# Patient Record
Sex: Male | Born: 1950
Health system: Southern US, Community
[De-identification: ages and names within clinical notes are randomized; demographics above are authoritative.]

## PROBLEM LIST (undated history)

## (undated) HISTORY — PX: SPINE SURGERY: SHX786

---

## 2001-06-07 ENCOUNTER — Encounter: Payer: Self-pay | Admitting: Family Medicine

## 2001-06-07 ENCOUNTER — Ambulatory Visit (HOSPITAL_COMMUNITY): Admission: RE | Admit: 2001-06-07 | Discharge: 2001-06-07 | Payer: Self-pay | Admitting: Family Medicine

## 2008-03-31 ENCOUNTER — Ambulatory Visit: Payer: Self-pay | Admitting: Cardiology

## 2008-05-04 ENCOUNTER — Ambulatory Visit: Payer: Self-pay | Admitting: Cardiology

## 2011-04-01 NOTE — Assessment & Plan Note (Signed)
[facility]                            CARDIOLOGY OFFICE NOTE   David Frank, David Frank                        MRN:          [id number]  DATE:03/31/2008                            DOB:          [birth year]    PRIMARY CARE PHYSICIAN:  Mosetta Putt, M.D.   REASON FOR PRESENTATION:  Evaluate patient with chest pain.   HISTORY OF PRESENT ILLNESS:  The patient is a pleasant 60 year old white  gentleman with no prior cardiac history.  He has had some chest  discomfort starting in November or December.  He has had some electrical  electrical type discomfort in the left side.  It would happen 1-2  times per day.  However, over the past 4-6 weeks he has had more  discomfort under his sternum.  These have been short-lived, dull pains  that radiate under his clavicle.  When he is having this he feels like  he has some discomfort taking a deep breath.  It will happen multiple  times a day every day.  He thinks it has been a relatively stable  pattern over the last few weeks.  However, the other day he played  tennis, which he usually does.  He had no symptoms that night.  However,  the next day he felt numbness in both of his elbows.  His heart was  racing.  He had some discomfort.  It was about 4-5/10 in intensity.  He  was lightheaded.  It all went away spontaneously.  He is not sure how  long it lasted.  He had no associated nausea, vomiting or diaphoresis.  He had no radiation to his jaw.  He was able play tennis the next day  without bringing on these symptoms, though he thought he was slightly  more winded than he might otherwise have been.  Because of this he is  referred for further evaluation.   PAST MEDICAL HISTORY:  1. Dyslipidemia.  His triglycerides have been high and his HDL      slightly low.  2. Migraines.  3. Rosacea.   PAST SURGICAL HISTORY:  None.   ALLERGIES/INTOLERANCES:  CEFACLOR.   MEDICATIONS:  1. Lipitor 10 mg daily.  2.  Nabumetone 750 mg daily.  3. Amitriptyline 30 mg daily.  4. Betamethasone.   SOCIAL HISTORY:  The patient is a Actor.  He is married.  He has 2 children, ages 29 and 26.  He never smoked cigarettes.  He  drinks alcohol socially.   FAMILY HISTORY:  Noncontributory for early coronary disease.  His father  died at 64 of pulmonary fibrosis.   REVIEW OF SYSTEMS:  Positive for migraines, some difficulty swallowing  in the past, some difficulty sleeping following the death of this  father.  Negative for other systems.   PHYSICAL EXAMINATION:  The patient is well-appearing and in no distress.  Blood pressure 128/80, heart rate 93 and regular.  HEENT:  Eyelids unremarkable.  Pupils equal, round and reactive to  light.  Fundi within normal limits.  Oral mucosa unremarkable.  NECK:  No jugular distention at 45 degrees.  Carotid upstroke brisk and  symmetric.  No bruits, no thyromegaly.  LYMPHATICS:  No cervical, axillary or inguinal adenopathy.  LUNGS:  Clear to auscultation bilaterally.  BACK:  No costovertebral angle tenderness.  CHEST:  Unremarkable.  HEART:  PMI not displaced or sustained, S1 and S2 within normal limits,  no S3, no S4, no clicks, no rubs, no murmurs.  ABDOMEN:  Obese, positive bowel sounds, normal in frequency and pitch.  No bruits, no rebound, no guarding, no midline pulsatile mass, no  hepatomegaly, no splenomegaly.  SKIN:  No rashes, no nodules.  EXTREMITIES:  Pulses 2+ throughout.  No edema, no cyanosis, no clubbing.  NEUROLOGIC:  Oriented to person, place and time.  Cranial nerves II-XII  grossly intact.  Motor grossly intact throughout.   EKG (done in Dr. Geoffery Lyons office):  Sinus rhythm, rate 68, axis within  normal limits, intervals within normal limits, no acute ST-T wave  changes.   ASSESSMENT/PLAN:  1. Chest discomfort.  The patient's chest discharged comfort is      atypical.  However, he did have a worrisome episode with discomfort       of both his arms.  Given this, I am going to schedule him for a      plain old exercise treadmill (POET).  This will be an appropriate      test given the low-moderate pretest probability of obstructive      coronary disease.  This will allow me to risk-stratify him.  Also      it will allow me to give him a prescription for exercise as he is      wanting to get back on his exercise bicycle.  Though he is active      and plays tennis, he probably could have a more routine regimen.  2. Obesity.  We discussed the need to lose weight with diet and      exercise.  He is obese and fit but still could lose weight.  Again,      I will advise him at the time of his stress test.  3. Dyslipidemia.  We discussed this.  He has predominately low HDL      now.  This is difficult to treat and he is doing all the dietary      changes I would suggest.  Hopefully, increased exercise will      increase it about 10%.  He remained on the Lipitor.  4. Palpitations.  This was in the context of his discomfort.  He      thinks his resting heart rate has been a little higher than usual.      We will assess his heart rate response and heart rate      recovery with stress test.  5. Follow-up will be at the time of the POET.     Rollene Rotunda, MD, [facility]  Electronically Signed    JH/MedQ  DD: 03/31/2008  DT: 03/31/2008  Job #: [id number]   cc:   Mosetta Putt, M.D.

## 2011-04-01 NOTE — Procedures (Signed)
[facility]                              EXERCISE SAVIO, ALBRECHT                        MRN:          [id number]  DATE:05/04/2008                            DOB:          22-Sep-1951    PRIMARY CARE PHYSICIAN:  Mosetta Putt, MD.   PROCEDURE:  Exercise treadmill test.   INDICATIONS:  Evaluate the patient with chest pain.   PROCEDURE NOTE:  The patient was exercised using standard Bruce  protocol.  He was able to exercise for 9 minutes, which completed stage  III.  He achieved 10.4 METs.  He achieved a 164 beats per minute, which  was 99% of his target heart rate.  His blood pressure response was  appropriate with a maximum of 174/71.  He had no chest discomfort.  He  had no dyspnea.  There were no ischemic ST-T wave changes.  He had a  normal heart rate recovery.   CONCLUSION:  Negative adequate exercise treadmill test.   PLAN:  Based on the above, there is no evidence of high-grade  obstructive coronary artery disease.  He had a reasonable exercise  tolerance and probably could have gone further if I had pushed him more.  Based on this study, I did give him specific instructions for an  exercise regimen.  I do not think any further cardiovascular testing is  suggested.  Of note, his resting heart rate was slightly high, but I do  believe that he would be able to bring this down with a routine of  aerobic exercise.  He does play tennis, but will probably focus more on  routine aerobic exercise.     Rollene Rotunda, MD, [facility]  Electronically Signed    JH/MedQ  DD: 05/04/2008  DT: 05/05/2008  Job #: [id number]   cc:   Mosetta Putt, M.D.

## 2015-11-14 ENCOUNTER — Ambulatory Visit
Admission: RE | Admit: 2015-11-14 | Discharge: 2015-11-14 | Disposition: A | Discharge status: Home / Self Care | Payer: BC Managed Care – PPO | Source: Ambulatory Visit | Attending: Nurse Practitioner | Admitting: Nurse Practitioner

## 2015-11-14 ENCOUNTER — Other Ambulatory Visit: Payer: Self-pay | Admitting: Nurse Practitioner

## 2015-11-14 DIAGNOSIS — S76012A Strain of muscle, fascia and tendon of left hip, initial encounter: Secondary | ICD-10-CM

## 2015-11-14 DIAGNOSIS — M545 Low back pain, unspecified: Secondary | ICD-10-CM

## 2016-03-27 ENCOUNTER — Other Ambulatory Visit: Payer: Self-pay | Admitting: Internal Medicine

## 2016-03-27 DIAGNOSIS — M545 Low back pain, unspecified: Secondary | ICD-10-CM

## 2016-03-31 ENCOUNTER — Ambulatory Visit
Admission: RE | Admit: 2016-03-31 | Discharge: 2016-03-31 | Disposition: A | Discharge status: Home / Self Care | Payer: BC Managed Care – PPO | Source: Ambulatory Visit | Attending: Internal Medicine | Admitting: Internal Medicine

## 2016-03-31 DIAGNOSIS — M545 Low back pain, unspecified: Secondary | ICD-10-CM

## 2016-08-27 ENCOUNTER — Encounter: Payer: Self-pay | Admitting: Physical Therapy

## 2016-08-27 ENCOUNTER — Ambulatory Visit: Payer: BC Managed Care – PPO | Attending: Neurosurgery | Admitting: Physical Therapy

## 2016-08-27 DIAGNOSIS — R262 Difficulty in walking, not elsewhere classified: Secondary | ICD-10-CM | POA: Diagnosis present

## 2016-08-27 DIAGNOSIS — M5442 Lumbago with sciatica, left side: Secondary | ICD-10-CM | POA: Insufficient documentation

## 2016-08-27 DIAGNOSIS — M6281 Muscle weakness (generalized): Secondary | ICD-10-CM | POA: Diagnosis present

## 2016-08-27 DIAGNOSIS — G8929 Other chronic pain: Secondary | ICD-10-CM | POA: Diagnosis present

## 2016-08-27 NOTE — Patient Instructions (Signed)
PELVIC TILT: Posterior    Tighten abdominals, flatten low back. _15__ reps, hold each 5 seconds , __2_ days per week   Copyright  VHI. All rights reserved.   On Elbows (Prone)    Rise up on elbows as high as possible, keeping hips on floor. Hold _2 minutes___ seconds.  Do __3__ sets per session. Do __2__ sessions per day.  http://orth.exer.us/92     Hamstring Stretch    With other leg bent, foot flat, grasp right leg and slowly try to straighten knee. Hold __30__ seconds. Repeat __5_ times. Do _2_ sessions per day.  [email or web address].[email or web address]   C

## 2016-08-27 NOTE — Therapy (Signed)
[facility] [address] [place], [place], [address] Phone: [phone]   Fax:  [phone]  Physical Therapy Evaluation  Patient Details  Name: David Frank MRN: [id number] Date of Birth: 1951/07/05 Referring Provider: Shirlean Kelly, MD  Encounter Date: 08/27/2016      PT End of Session - 08/27/16 1203    Visit Number 1   Number of Visits 13   Date for PT Re-Evaluation 10/27/16   PT Start Time 1010   PT Stop Time 1100   PT Time Calculation (min) 50 min   Activity Tolerance Patient tolerated treatment well   Behavior During Therapy [facility] for tasks assessed/performed      History reviewed. No pertinent past medical history.  Past Surgical History:  Procedure Laterality Date  . SPINE SURGERY      There were no vitals filed for this visit.       Subjective Assessment - 08/27/16 1015    Subjective Pt arriving to therapy c/o low back pain since December 2016. Pt s/p L4-L% discectomy on 04/16/16. Pt reporting relief from sciatic pain running down his left LE following surgery. Pt c/o of midline low back pain in lumbar region.    How long can you sit comfortably? 15 minutes   How long can you stand comfortably? 10 minutes   How long can you walk comfortably? pain to walk   Diagnostic tests X-ray 03/2016   Patient Stated Goals play tennis   Currently in Pain? Yes   Pain Score 5    Pain Location Back   Pain Orientation Mid;Lower   Pain Descriptors / Indicators Aching   Pain Type Chronic pain   Pain Onset More than a month ago   Pain Frequency Constant   Aggravating Factors  walking, bending, lifting   Pain Relieving Factors pain meds, positioning   Effect of Pain on Daily Activities I can't play tennis, I use to play competitively            [facility] PT Assessment - 08/27/16 0001      Assessment   Medical Diagnosis low back pain s/p l4-5 disectomy 04/16/16   Referring Provider Shirlean Kelly, MD   Onset Date/Surgical Date 04/16/16   Hand Dominance Right   Prior Therapy yes, prior to surgery     Precautions   Precautions None     Restrictions   Weight Bearing Restrictions No     Balance Screen   Has the patient fallen in the past 6 months No   Is the patient reluctant to leave their home because of a fear of falling?  No     Home [employer]ist information centre manager residence   Living Arrangements Spouse/significant other   Available Help at Discharge Family   Type of Home House   Home Access Stairs to enter   Entrance Stairs-Number of Steps 5   Entrance Stairs-Rails None   Home Layout One level   Home Equipment None   Additional Comments pt has lumbar belt that he wears to play tennis and do yard work     Prior Function   Level of Independence Independent   Vocation Retired   Leisure play tennis     Cognition   Overall Cognitive Status Within Functional Limits for tasks assessed     Observation/Other Assessments   Focus on Therapeutic Outcomes (FOTO)  53% limitation     Sensation   Light Touch Appears Intact     Coordination  Heel Shin Test intact     Posture/Postural Control   Posture/Postural Control Postural limitations   Postural Limitations Rounded Shoulders;Forward head     ROM / Strength   AROM / PROM / Strength AROM;Strength     AROM   AROM Assessment Site Hip;Knee   Right/Left Knee Right;Left     Strength   Strength Assessment Site Hip;Knee   Right/Left Hip Right;Left   Right Hip Flexion 5/5   Right Hip Extension 5/5   Right Hip ABduction 5/5   Right Hip ADduction 4/5   Left Hip Flexion 4/5   Left Hip Extension 4/5   Left Hip ABduction 5/5   Left Hip ADduction 4/5   Right/Left Knee Right;Left   Right Knee Flexion 5/5   Right Knee Extension 5/5   Left Knee Flexion 4/5   Left Knee Extension 5/5     Palpation   Patella mobility Pt tenderness noted over lumbar paraspinals bilateral sides.      Special Tests    Special Tests  Lumbar;Hip Special Tests   Lumbar Tests Straight Leg Raise   Hip Special Tests  Thomas Test     Straight Leg Raise   Findings Negative   Side  Right  left     Thomas Test    Findings Positive   Side Right;Left     Transfers   Transfers Sit to Stand   Five time sit to stand comments  17 seconds                           PT Education - 08/27/16 1201    Education provided Yes   Education Details HEP: hamstring stretch, prone on elbows for hip flexion and lumbar flexibility, PPT   Person(s) Educated Patient   Methods Explanation;Demonstration;Tactile cues;Verbal cues;Handout   Comprehension Verbalized understanding;Returned demonstration          PT Short Term Goals - 08/27/16 1216      PT SHORT TERM GOAL #1   Title Pt will be independent with his HEP.    Baseline issued begining exercises at eval   Time 3   Period Weeks   Status New     PT SHORT TERM GOAL #2   Title pt will be able to demonstrate correct body mechanics for lifting anf retrieving on object off the floor.    Baseline unable to do without pain   Time 3   Period Weeks   Status New     PT SHORT TERM GOAL #3   Title Pt will improve his pain to less than 3/10 with ADL's.    Baseline 5/10 at rest   Time 3   Period Weeks   Status New     PT SHORT TERM GOAL #4   Title Pt will be able to perform supine to sit with no pain using logroll technique.    Baseline pain reported during eval   Time 3   Period Weeks   Status New           PT Long Term Goals - 08/27/16 1221      PT LONG TERM GOAL #1   Title Pt wil lbe independent with HEP and progression.    Time 6   Period Weeks   Status New     PT LONG TERM GOAL #2   Title Pt will improve bilateral hip strength to 5/5 in order to improve functional mobility and gait.  Time 6   Period Weeks   Status New     PT LONG TERM GOAL #3   Title pt will be able to improve FOTO score from 53% limitation to less than 43% limitation in  order to improve functional mobility.    Time 6   Period Weeks   Status New     PT LONG TERM GOAL #4   Title Pt will improve R hamstring flexibility to >/=75 degrees to compare to his left side in order to improve gait and prevent injury.    Baseline left SLR= 80 degrees, R SLR= 62 degrees   Time 6   Period Weeks   Status New               Plan - 08/27/16 1206    Clinical Impression Statement pt presents following a L4-5 discectomy on 04/16/16 with increasing lumbar pain. Pt was an active tennis player prior to surgery and wishes to return to tennis. Pt with 5/10 pain reported today. Pt with mild weakness noted in his left hip and hamstring following MMT and positive thomas test bilaterally. Skilled PT needed to progress pt toward goals set to improve pt's functional mobility.     Rehab Potential Good   PT Frequency 2x / week   PT Duration 6 weeks   PT Treatment/Interventions ADLs/Self Care Home Management;Moist Heat;Therapeutic activities;Therapeutic exercise;Traction;Manual techniques;Dry needling;Taping;Passive range of motion;Patient/family education;Neuromuscular re-education;Balance training;Ultrasound;Gait training;Stair training;Functional mobility training;Electrical Stimulation;Cryotherapy;Iontophoresis 4mg /ml Dexamethasone   PT Next Visit Plan progress lumbar exercises for stretching and strengthening, measure lumabr ROM, review HEP   PT Home Exercise Plan hamstring stretches, prone on elbows, PPT   Consulted and Agree with Plan of Care Patient      Patient will benefit from skilled therapeutic intervention in order to improve the following deficits and impairments:  Pain, Decreased mobility, Decreased strength, Decreased activity tolerance, Impaired perceived functional ability, Difficulty walking, Decreased range of motion  Visit Diagnosis: Chronic bilateral low back pain with left-sided sciatica  Difficulty in walking, not elsewhere classified  Muscle weakness  (generalized)     Problem List There are no active problems to display for this patient.   Sharmon Leyden, MPT  08/27/2016, 12:28 PM   [facility] [address] [place], [place], [address] Phone: [phone]   Fax:  [phone]  Name: David Frank MRN: [id number] Date of Birth: [birth year]

## 2016-09-03 ENCOUNTER — Ambulatory Visit: Payer: BC Managed Care – PPO | Admitting: Physical Therapy

## 2016-09-03 ENCOUNTER — Encounter: Payer: Self-pay | Admitting: Physical Therapy

## 2016-09-03 DIAGNOSIS — G8929 Other chronic pain: Secondary | ICD-10-CM

## 2016-09-03 DIAGNOSIS — M6281 Muscle weakness (generalized): Secondary | ICD-10-CM

## 2016-09-03 DIAGNOSIS — R262 Difficulty in walking, not elsewhere classified: Secondary | ICD-10-CM

## 2016-09-03 DIAGNOSIS — M5442 Lumbago with sciatica, left side: Principal | ICD-10-CM

## 2016-09-03 NOTE — Therapy (Signed)
[facility] [address], [place], [place], [address] Phone: [phone]   Fax:  [phone]  Physical Therapy Treatment  Patient Details  Name: David Frank MRN: [id number] Date of Birth: October 22, 1951 Referring Provider: Jovita Gamma, MD  Encounter Date: 09/03/2016      PT End of Session - 09/03/16 1224    Visit Number 2   Number of Visits 13   Date for PT Re-Evaluation 10/27/16   PT Start Time 9480   PT Stop Time 1245   PT Time Calculation (min) 60 min   Activity Tolerance Patient tolerated treatment well   Behavior During Therapy [facility] for tasks assessed/performed      History reviewed. No pertinent past medical history.  Past Surgical History:  Procedure Laterality Date  . SPINE SURGERY      There were no vitals filed for this visit.      Subjective Assessment - 09/03/16 1152    Subjective I still have the pain. Since last visit I am having a pain in the medial left thigh.    How long can you sit comfortably? 15 minutes   How long can you stand comfortably? 10 minutes   How long can you walk comfortably? pain to walk   Diagnostic tests X-ray 03/2016   Patient Stated Goals play tennis   Currently in Pain? Yes   Pain Score 2   10/10 worse   Pain Location Back   Pain Orientation Mid;Lower   Pain Descriptors / Indicators Aching;Dull   Pain Type Chronic pain   Pain Radiating Towards radiat to the left inner thigh   Pain Onset More than a month ago   Pain Frequency Constant   Aggravating Factors  step the wrong way, walking, bending, lifting   Pain Relieving Factors pain medication, postioning                         [facility] Adult PT Treatment/Exercise - 09/03/16 0001      Exercises   Exercises Lumbar     Lumbar Exercises: Stretches   Active Hamstring Stretch 2 reps;30 seconds   Active Hamstring Stretch Limitations sitting with pulling in left lumbar   Piriformis Stretch 2  reps;30 seconds  bil.      Modalities   Modalities Electrical Stimulation;Moist Heat     Moist Heat Therapy   Number Minutes Moist Heat 20 Minutes   Moist Heat Location Lumbar Spine  prone     Electrical Stimulation   Electrical Stimulation Location lumbar in prone   Electrical Stimulation Action IFC   Electrical Stimulation Parameters 20 min, to patient tolerance   Electrical Stimulation Goals Pain     Manual Therapy   Manual Therapy Soft tissue mobilization   Soft tissue mobilization lumbar and thoracic paraspinals in prone                PT Education - 09/03/16 1223    Education provided Yes   Education Details stretches and abdominal bracing   Person(s) Educated Patient   Methods Explanation;Demonstration;Verbal cues;Handout   Comprehension Returned demonstration;Verbalized understanding          PT Short Term Goals - 09/03/16 1227      PT SHORT TERM GOAL #1   Title Pt will be independent with his HEP.    Baseline issued begining exercises at eval   Time 3   Period Weeks   Status On-going     PT SHORT  TERM GOAL #2   Title pt will be able to demonstrate correct body mechanics for lifting anf retrieving on object off the floor.    Baseline unable to do without pain   Time 3   Period Weeks   Status New     PT SHORT TERM GOAL #3   Title Pt will improve his pain to less than 3/10 with ADL's.    Baseline 5/10 at rest   Time 3   Period Weeks   Status New     PT SHORT TERM GOAL #4   Title Pt will be able to perform supine to sit with no pain using logroll technique.    Baseline pain reported during eval   Time 3   Period Weeks   Status New           PT Long Term Goals - 08/27/16 1221      PT LONG TERM GOAL #1   Title Pt wil lbe independent with HEP and progression.    Time 6   Period Weeks   Status New     PT LONG TERM GOAL #2   Title Pt will improve bilateral hip strength to 5/5 in order to improve functional mobility and gait.     Time 6   Period Weeks   Status New     PT LONG TERM GOAL #3   Title pt will be able to improve FOTO score from 53% limitation to less than 43% limitation in order to improve functional mobility.    Time 6   Period Weeks   Status New     PT LONG TERM GOAL #4   Title Pt will improve R hamstring flexibility to >/=75 degrees to compare to his left side in order to improve gait and prevent injury.    Baseline left SLR= 80 degrees, R SLR= 62 degrees   Time 6   Period Weeks   Status New               Plan - 09/03/16 1224    Clinical Impression Statement Patient is able to perfrom his stretches correctly.  Patient has not met goals due to just starting therapy.  Patient needed many vebal cues to contract his abdominal without leg pain.  Patient needs verbal cues to brace his abdominal while getting  on and off the mat. Patient has tightness in paraspinals.  Patient will benefit from skilled therapy to reduce pain and improve function.    Rehab Potential Good   Clinical Impairments Affecting Rehab Potential None   PT Frequency 2x / week   PT Duration 6 weeks   PT Treatment/Interventions ADLs/Self Care Home Management;Moist Heat;Therapeutic activities;Therapeutic exercise;Traction;Manual techniques;Dry needling;Taping;Passive range of motion;Patient/family education;Neuromuscular re-education;Balance training;Ultrasound;Gait training;Stair training;Functional mobility training;Electrical Stimulation;Cryotherapy;Iontophoresis 33m/ml Dexamethasone   PT Next Visit Plan progress lumbar exercises for stretching and strengthening, measure lumabr ROM, body mechanics with daily activities   PT Home Exercise Plan progress as needed   Recommended Other Services none   Consulted and Agree with Plan of Care Patient      Patient will benefit from skilled therapeutic intervention in order to improve the following deficits and impairments:  Pain, Decreased mobility, Decreased strength, Decreased  activity tolerance, Impaired perceived functional ability, Difficulty walking, Decreased range of motion  Visit Diagnosis: Chronic bilateral low back pain with left-sided sciatica  Difficulty in walking, not elsewhere classified  Muscle weakness (generalized)     Problem List There are no active problems to display for  this patient.   Earlie Counts, PT 09/03/16 12:28 PM   [facility] [address], [place], [place], [address] Phone: [phone]   Fax:  [phone]  Name: David Frank MRN: [id number] Date of Birth: 15-Aug-1951

## 2016-09-03 NOTE — Patient Instructions (Addendum)
Piriformis Stretch, Sitting    Sit, one ankle on opposite knee, same-side hand on crossed knee. Push down on knee, keeping spine straight. Lean torso forward, with flat back, until tension is felt in hamstrings and gluteals of crossed-leg side. Hold _30__ seconds.  Repeat _2__ times per session. Do _1__ sessions per day.  Copyright  VHI. All rights reserved.  Chair Sitting    Sit at edge of seat, spine straight, one leg extended. Put a hand on each thigh and bend forward from the hip, keeping spine straight. Allow hand on extended leg to reach toward toes. Support upper body with other arm. Hold _30__ seconds. Repeat __2_ times per session. Do _1__ sessions per day.  Copyright  VHI. All rights reserved.  Concentric Contraction With Pelvic Floor (Hook-Lying)    Lie with hips and knees bent. Slowly inhale, and then exhale. Pull navel toward spine and tighten pelvic floor. Hold 5 sec Relax. Repeat _5__ times. Do _1__ times a day.   Copyright  VHI. All rights reserved.  Bracing With Knee Fallout (Hook-Lying)    With neutral spine, tighten pelvic floor and abdominals and hold. Alternating legs, drop knee out to side. Keep opposite hip still. Repeat _10__ times. Do __1_ times a day.   Copyright  VHI. All rights reserved.  [facility] [address], [address] [place], [place] [address] Phone # [phone] Fax [phone]

## 2016-09-05 ENCOUNTER — Ambulatory Visit: Payer: BC Managed Care – PPO | Admitting: Physical Therapy

## 2016-09-05 DIAGNOSIS — R262 Difficulty in walking, not elsewhere classified: Secondary | ICD-10-CM

## 2016-09-05 DIAGNOSIS — M5442 Lumbago with sciatica, left side: Secondary | ICD-10-CM | POA: Diagnosis not present

## 2016-09-05 DIAGNOSIS — M6281 Muscle weakness (generalized): Secondary | ICD-10-CM

## 2016-09-05 DIAGNOSIS — G8929 Other chronic pain: Secondary | ICD-10-CM

## 2016-09-05 NOTE — Patient Instructions (Signed)
       Perform each stretch 3 times on each LE holding 20-30 seconds each. Perform 2 times a day.

## 2016-09-05 NOTE — Therapy (Signed)
[facility] [address], [address] [place], [place], [address] Phone: [phone]   Fax:  [phone]  Physical Therapy Treatment  Patient Details  Name: David Frank MRN: [id number] Date of Birth: Nov 07, 1951 Referring Provider: Shirlean Kelly, MD  Encounter Date: 09/05/2016      PT End of Session - 09/05/16 1051    Visit Number 3   Number of Visits 13   Date for PT Re-Evaluation 10/27/16   PT Start Time 1015   PT Stop Time 1100   PT Time Calculation (min) 45 min   Activity Tolerance Patient tolerated treatment well   Behavior During Therapy [facility] for tasks assessed/performed      No past medical history on file.  Past Surgical History:  Procedure Laterality Date  . SPINE SURGERY      There were no vitals filed for this visit.      Subjective Assessment - 09/05/16 1022    Subjective Pt reported still having intermittent back pain and currently reporting 5-6/10. Pt reported feeling relief following last session with IFC and heat. Pt reported he started using heating pad at honme which seems to help with stretching.    Limitations Lifting;House hold activities;Sitting   How long can you sit comfortably? 15 minutes   How long can you stand comfortably? 10 minutes   How long can you walk comfortably? pain to walk   Diagnostic tests X-ray 03/2016   Patient Stated Goals play tennis   Currently in Pain? Yes   Pain Score 6    Pain Location Back   Pain Orientation Mid;Lower   Pain Type Chronic pain   Pain Radiating Towards pt reporting occassional radiation down lwft thigh   Pain Onset More than a month ago   Pain Frequency Intermittent   Aggravating Factors  stepping the wrong way, walking, bending, lifting   Pain Relieving Factors pain medication, positioning and heat   Effect of Pain on Daily Activities I want to return to playing tennis   Multiple Pain Sites No                         [facility]  Adult PT Treatment/Exercise - 09/05/16 0001      Exercises   Exercises Lumbar     Lumbar Exercises: Stretches   Active Hamstring Stretch 2 reps;30 seconds   Single Knee to Chest Stretch 3 reps;20 seconds   Piriformis Stretch 3 reps;30 seconds  bil.      Lumbar Exercises: Aerobic   Stationary Bike NUSTEP 8 minutes level 2     Lumbar Exercises: Supine   Bridge 10 reps;5 seconds   Bridge Limitations decreased lift from mat table   Other Supine Lumbar Exercises supine marching x 10 reps                PT Education - 09/05/16 1050    Education provided Yes   Education Details new stretrches added to [employer]) Educated Patient   Methods Explanation;Demonstration;Handout;Tactile cues;Verbal cues   Comprehension Verbalized understanding;Returned demonstration          PT Short Term Goals - 09/05/16 1114      PT SHORT TERM GOAL #1   Title Pt will be independent with his HEP.    Baseline issued begining exercises at eval, new exercises added today   Time 3   Period Weeks   Status On-going     PT SHORT TERM GOAL #2  Title pt will be able to demonstrate correct body mechanics for lifting anf retrieving on object off the floor.    Baseline unable to do without pain   Time 3   Period Weeks   Status New     PT SHORT TERM GOAL #3   Title Pt will improve his pain to less than 3/10 with ADL's.    Baseline 5/10 at rest   Period Weeks   Status New     PT SHORT TERM GOAL #4   Title Pt will be able to perform supine to sit with no pain using logroll technique.    Baseline pain reported during eval   Period Weeks   Status New           PT Long Term Goals - 08/27/16 1221      PT LONG TERM GOAL #1   Title Pt wil lbe independent with HEP and progression.    Time 6   Period Weeks   Status New     PT LONG TERM GOAL #2   Title Pt will improve bilateral hip strength to 5/5 in order to improve functional mobility and gait.    Time 6   Period Weeks   Status  New     PT LONG TERM GOAL #3   Title pt will be able to improve FOTO score from 53% limitation to less than 43% limitation in order to improve functional mobility.    Time 6   Period Weeks   Status New     PT LONG TERM GOAL #4   Title Pt will improve R hamstring flexibility to >/=75 degrees to compare to his left side in order to improve gait and prevent injury.    Baseline left SLR= 80 degrees, R SLR= 62 degrees   Time 6   Period Weeks   Status New               Plan - 09/05/16 1052    Clinical Impression Statement Pt arriving to therapy complaining of 6/10 lumbar pain. Pt performed his HEP stretches and 2 new exercises added. pt reporting relief after using the IFC and heat. Pt tolerated well still requiring therpy to imporove strength, ROM, decrease pain and improve functional mobility.    Rehab Potential Good   Clinical Impairments Affecting Rehab Potential None   PT Frequency 2x / week   PT Duration 6 weeks   PT Treatment/Interventions ADLs/Self Care Home Management;Moist Heat;Therapeutic activities;Therapeutic exercise;Traction;Manual techniques;Dry needling;Taping;Passive range of motion;Patient/family education;Neuromuscular re-education;Balance training;Ultrasound;Gait training;Stair training;Functional mobility training;Electrical Stimulation;Cryotherapy;Iontophoresis 4mg /ml Dexamethasone   PT Next Visit Plan progress lumbar exercises for stretching and strengthening, measure lumabr ROM, body mechanics with daily activities   PT Home Exercise Plan progress as needed   Consulted and Agree with Plan of Care Patient      Patient will benefit from skilled therapeutic intervention in order to improve the following deficits and impairments:  Pain, Decreased mobility, Decreased strength, Decreased activity tolerance, Impaired perceived functional ability, Difficulty walking, Decreased range of motion  Visit Diagnosis: Chronic bilateral low back pain with left-sided  sciatica  Difficulty in walking, not elsewhere classified  Muscle weakness (generalized)     Problem List There are no active problems to display for this patient.   Sharmon LeydenJennifer R Malikiah Debarr, MPT  09/05/2016, 11:17 AM   [facility] [address], [address] [place], [place], [address] Phone: [phone]   Fax:  [phone]  Name: David PersonRichard Frank MRN: [id number] Date  of Birth: 15-Jun-1951

## 2016-09-09 ENCOUNTER — Ambulatory Visit: Payer: BC Managed Care – PPO | Admitting: Physical Therapy

## 2016-09-09 ENCOUNTER — Encounter: Payer: Self-pay | Admitting: Physical Therapy

## 2016-09-09 DIAGNOSIS — G8929 Other chronic pain: Secondary | ICD-10-CM

## 2016-09-09 DIAGNOSIS — M6281 Muscle weakness (generalized): Secondary | ICD-10-CM

## 2016-09-09 DIAGNOSIS — M5442 Lumbago with sciatica, left side: Secondary | ICD-10-CM | POA: Diagnosis not present

## 2016-09-09 DIAGNOSIS — R262 Difficulty in walking, not elsewhere classified: Secondary | ICD-10-CM

## 2016-09-09 NOTE — Therapy (Signed)
[facility] [address], [address] [place], [place], [address] Phone: [phone]   Fax:  [phone]  Physical Therapy Treatment  Patient Details  Name: David Frank MRN: [id number] Date of Birth: 1951-08-26 Referring Provider: Shirlean Kelly, MD  Encounter Date: 09/09/2016      PT End of Session - 09/09/16 0938    Visit Number 4   Number of Visits 13   Date for PT Re-Evaluation 10/27/16   PT Start Time 0932   PT Stop Time 1031   PT Time Calculation (min) 59 min   Activity Tolerance Patient tolerated treatment well   Behavior During Therapy [facility] for tasks assessed/performed      History reviewed. No pertinent past medical history.  Past Surgical History:  Procedure Laterality Date  . SPINE SURGERY      There were no vitals filed for this visit.      Subjective Assessment - 09/09/16 0936    Subjective Pt reports still having a catch on the ride side. Other pain areas are easing up but Rt side continues to intermittently catch.    Limitations Lifting;House hold activities;Sitting   How long can you sit comfortably? 15 minutes   How long can you stand comfortably? 10 minutes   How long can you walk comfortably? pain to walk   Diagnostic tests X-ray 03/2016   Patient Stated Goals play tennis   Currently in Pain? Yes   Pain Score 2    Pain Location Back   Pain Orientation Mid;Lower   Pain Descriptors / Indicators Aching;Dull   Pain Type Chronic pain   Pain Onset More than a month ago                         [facility] Adult PT Treatment/Exercise - 09/09/16 0001      Lumbar Exercises: Standing   Other Standing Lumbar Exercises Pt educated on proper body mechanics  Practiced squat technique     Lumbar Exercises: Supine   Bridge 10 reps;5 seconds   Bridge Limitations decreased lift from mat table   Other Supine Lumbar Exercises supine marching x 10 reps     Modalities   Modalities Traction     Traction   Type of Traction Lumbar   Min (lbs) 50   Max (lbs) 100   Hold Time 60 seconds   Rest Time 10 seconds   Time 15 minutes                  PT Short Term Goals - 09/09/16 9528      PT SHORT TERM GOAL #1   Title Pt will be independent with his HEP.    Baseline issued begining exercises at eval, new exercises added today   Time 3   Period Weeks   Status On-going     PT SHORT TERM GOAL #2   Title pt will be able to demonstrate correct body mechanics for lifting anf retrieving on object off the floor.    Baseline unable to do without pain   Time 3   Period Weeks   Status On-going     PT SHORT TERM GOAL #3   Title Pt will improve his pain to less than 3/10 with ADL's.    Baseline 5/10 at rest   Time 3   Period Weeks   Status On-going     PT SHORT TERM GOAL #4   Title Pt will be able to perform  supine to sit with no pain using logroll technique.    Baseline pain reported during eval   Time 3   Period Weeks   Status On-going           PT Long Term Goals - 09/09/16 0947      PT LONG TERM GOAL #1   Title Pt wil lbe independent with HEP and progression.    Time 6   Period Weeks   Status On-going     PT LONG TERM GOAL #2   Title Pt will improve bilateral hip strength to 5/5 in order to improve functional mobility and gait.    Time 6   Period Weeks   Status On-going     PT LONG TERM GOAL #3   Title pt will be able to improve FOTO score from 53% limitation to less than 43% limitation in order to improve functional mobility.    Time 6   Period Weeks   Status On-going     PT LONG TERM GOAL #4   Title Pt will improve R hamstring flexibility to >/=75 degrees to compare to his left side in order to improve gait and prevent injury.    Baseline left SLR= 80 degrees, R SLR= 62 degrees   Time 6   Period Weeks   Status On-going               Plan - 09/09/16 1011    Clinical Impression Statement Pt reports less lumbar pain today but  continues to have intermitent pain increase especially on Rt side. Pt has been doing home exercises and says the stretches are helping. Pt able to tolerate all core strenghtening exercises well today. Pt educated on proper body mechanics with lifting and transfering in and out of bed. Pt communicated and demonstrated understanding. Pt will continue to benefit from skilled therapy for core strength and stability.    Rehab Potential Good   Clinical Impairments Affecting Rehab Potential None   PT Frequency 2x / week   PT Duration 6 weeks   PT Treatment/Interventions ADLs/Self Care Home Management;Moist Heat;Therapeutic activities;Therapeutic exercise;Traction;Manual techniques;Dry needling;Taping;Passive range of motion;Patient/family education;Neuromuscular re-education;Balance training;Ultrasound;Gait training;Stair training;Functional mobility training;Electrical Stimulation;Cryotherapy;Iontophoresis 4mg /ml Dexamethasone   PT Next Visit Plan asses responce to mechanical traction, progress lumbar exercises for stretching and strengthening, measure lumabr ROM, body mechanics with daily activities   Consulted and Agree with Plan of Care Patient      Patient will benefit from skilled therapeutic intervention in order to improve the following deficits and impairments:  Pain, Decreased mobility, Decreased strength, Decreased activity tolerance, Impaired perceived functional ability, Difficulty walking, Decreased range of motion  Visit Diagnosis: Chronic bilateral low back pain with left-sided sciatica  Difficulty in walking, not elsewhere classified  Muscle weakness (generalized)     Problem List There are no active problems to display for this patient.   Dessa PhiKatherine Calynn Ferrero PTA 09/09/2016, 10:49 AM  [facility] [address], [address] [place], [place], [address] Phone: [phone]   Fax:  [phone]  Name: David PersonRichard Frank MRN:  [id number] Date of Birth: [birth year]

## 2016-09-12 ENCOUNTER — Encounter: Payer: Self-pay | Admitting: Physical Therapy

## 2016-09-12 ENCOUNTER — Ambulatory Visit: Payer: BC Managed Care – PPO | Admitting: Physical Therapy

## 2016-09-12 DIAGNOSIS — M5442 Lumbago with sciatica, left side: Secondary | ICD-10-CM | POA: Diagnosis not present

## 2016-09-12 DIAGNOSIS — R262 Difficulty in walking, not elsewhere classified: Secondary | ICD-10-CM

## 2016-09-12 DIAGNOSIS — G8929 Other chronic pain: Secondary | ICD-10-CM

## 2016-09-12 DIAGNOSIS — M6281 Muscle weakness (generalized): Secondary | ICD-10-CM

## 2016-09-12 NOTE — Therapy (Signed)
[facility] [address] [place], [place], [address] Phone: [phone]   Fax:  [phone]  Physical Therapy Treatment  Patient Details  Name: David Frank MRN: [id number] Date of Birth: Jul 26, 1951 Referring Provider: Shirlean Kelly, MD  Encounter Date: 09/12/2016      PT End of Session - 09/12/16 1019    Visit Number 5   Number of Visits 13   Date for PT Re-Evaluation 10/27/16   PT Start Time 1013   PT Stop Time 1117   PT Time Calculation (min) 64 min   Activity Tolerance Patient tolerated treatment well   Behavior During Therapy [facility] for tasks assessed/performed      History reviewed. No pertinent past medical history.  Past Surgical History:  Procedure Laterality Date  . SPINE SURGERY      There were no vitals filed for this visit.      Subjective Assessment - 09/12/16 1015    Subjective Pt reports feeling like hes making progress for the first time. Belives that traction helped last time but did have trouble immediatley after getting off of traction with increase muscle spasms. Felt much better the next day with less Rt sided pain.   Limitations Lifting;House hold activities;Sitting   How long can you sit comfortably? 15 minutes   How long can you stand comfortably? 10 minutes   How long can you walk comfortably? pain to walk   Diagnostic tests X-ray 03/2016   Patient Stated Goals play tennis   Currently in Pain? Yes   Pain Score 2    Pain Location Back   Pain Orientation Mid;Lower   Pain Descriptors / Indicators Aching;Dull   Pain Frequency Intermittent   Multiple Pain Sites No                         [facility] Adult PT Treatment/Exercise - 09/12/16 0001      Lumbar Exercises: Stretches   Single Knee to Chest Stretch 3 reps;20 seconds   Piriformis Stretch 3 reps;30 seconds  bil.      Lumbar Exercises: Aerobic   Stationary Bike NUSTEP 6 minutes level 2     Lumbar  Exercises: Supine   Ab Set 20 reps  on set ab sets; one set with red ball   Heel Slides 20 reps   Bridge 10 reps;5 seconds   Bridge Limitations decreased lift from mat table   Other Supine Lumbar Exercises supine marching x 10 reps     Modalities   Modalities Traction     Moist Heat Therapy   Number Minutes Moist Heat 15 Minutes   Moist Heat Location Lumbar Spine  prone     Electrical Stimulation   Electrical Stimulation Location lumbar in prone   Electrical Stimulation Action IFC   Electrical Stimulation Parameters To tolerance   Electrical Stimulation Goals Pain     Traction   Type of Traction Lumbar   Min (lbs) 50   Max (lbs) 100   Hold Time 60 seconds   Rest Time 10 seconds   Time 10                  PT Short Term Goals - 09/09/16 5284      PT SHORT TERM GOAL #1   Title Pt will be independent with his HEP.    Baseline issued begining exercises at eval, new exercises added today   Time 3   Period Weeks  Status On-going     PT SHORT TERM GOAL #2   Title pt will be able to demonstrate correct body mechanics for lifting anf retrieving on object off the floor.    Baseline unable to do without pain   Time 3   Period Weeks   Status On-going     PT SHORT TERM GOAL #3   Title Pt will improve his pain to less than 3/10 with ADL's.    Baseline 5/10 at rest   Time 3   Period Weeks   Status On-going     PT SHORT TERM GOAL #4   Title Pt will be able to perform supine to sit with no pain using logroll technique.    Baseline pain reported during eval   Time 3   Period Weeks   Status On-going           PT Long Term Goals - 09/09/16 0947      PT LONG TERM GOAL #1   Title Pt wil lbe independent with HEP and progression.    Time 6   Period Weeks   Status On-going     PT LONG TERM GOAL #2   Title Pt will improve bilateral hip strength to 5/5 in order to improve functional mobility and gait.    Time 6   Period Weeks   Status On-going     PT  LONG TERM GOAL #3   Title pt will be able to improve FOTO score from 53% limitation to less than 43% limitation in order to improve functional mobility.    Time 6   Period Weeks   Status On-going     PT LONG TERM GOAL #4   Title Pt will improve R hamstring flexibility to >/=75 degrees to compare to his left side in order to improve gait and prevent injury.    Baseline left SLR= 80 degrees, R SLR= 62 degrees   Time 6   Period Weeks   Status On-going               Plan - 09/12/16 1032    Clinical Impression Statement Pt had muscle spasms while getting off of traction table last session but believes traction helped. Pt did traction at begining of treatment and did exercises on traction table before getting off. Pt had a slight muscle spasm across back but not as bad. Able to tolerate all exerciese well and able to demonstrate good technique with sit to stand. Pt will continue to benefit from skilled therapy for core and LE strength and stability.    Rehab Potential Good   Clinical Impairments Affecting Rehab Potential None   PT Frequency 2x / week   PT Duration 6 weeks   PT Treatment/Interventions ADLs/Self Care Home Management;Moist Heat;Therapeutic activities;Therapeutic exercise;Traction;Manual techniques;Dry needling;Taping;Passive range of motion;Patient/family education;Neuromuscular re-education;Balance training;Ultrasound;Gait training;Stair training;Functional mobility training;Electrical Stimulation;Cryotherapy;Iontophoresis 4mg /ml Dexamethasone   PT Next Visit Plan progress lumbar exercises for stretching and strengthening, measure lumabr ROM, body mechanics with daily activities   Consulted and Agree with Plan of Care Patient      Patient will benefit from skilled therapeutic intervention in order to improve the following deficits and impairments:  Pain, Decreased mobility, Decreased strength, Decreased activity tolerance, Impaired perceived functional ability, Difficulty  walking, Decreased range of motion  Visit Diagnosis: Chronic bilateral low back pain with left-sided sciatica  Difficulty in walking, not elsewhere classified  Muscle weakness (generalized)     Problem List There are no active problems to display  for this patient.   Dessa PhiKatherine Keeva Reisen PTA 09/12/2016, 11:08 AM  [facility] [address], [address] [place], [place], [address] Phone: [phone]   Fax:  [phone]  Name: David Frank MRN: [id number] Date of Birth: [birth year]

## 2016-09-16 ENCOUNTER — Encounter: Payer: Self-pay | Admitting: Physical Therapy

## 2016-09-16 ENCOUNTER — Ambulatory Visit: Payer: BC Managed Care – PPO | Admitting: Physical Therapy

## 2016-09-16 DIAGNOSIS — M5442 Lumbago with sciatica, left side: Secondary | ICD-10-CM | POA: Diagnosis not present

## 2016-09-16 DIAGNOSIS — M6281 Muscle weakness (generalized): Secondary | ICD-10-CM

## 2016-09-16 DIAGNOSIS — G8929 Other chronic pain: Secondary | ICD-10-CM

## 2016-09-16 DIAGNOSIS — R262 Difficulty in walking, not elsewhere classified: Secondary | ICD-10-CM

## 2016-09-16 NOTE — Therapy (Signed)
[facility] [address] [place], [place], [address] Phone: [phone]   Fax:  [phone]  Physical Therapy Treatment  Patient Details  Name: David PersonRichard Alf MRN: [id number] Date of Birth: [birth year] Referring Provider: Shirlean Kellyobert Nudelman, MD  Encounter Date: 09/16/2016      PT End of Session - 09/16/16 0854    Visit Number 6   Number of Visits 13   Date for PT Re-Evaluation 10/27/16   PT Start Time 0846   PT Stop Time 0947   PT Time Calculation (min) 61 min   Activity Tolerance Patient tolerated treatment well   Behavior During Therapy [facility] for tasks assessed/performed      History reviewed. No pertinent past medical history.  Past Surgical History:  Procedure Laterality Date  . SPINE SURGERY      There were no vitals filed for this visit.      Subjective Assessment - 09/16/16 0851    Subjective Pt reports feeling ok after last session but continues to have a dull ache in back and increased pain with certain movements.    Limitations Lifting;House hold activities;Sitting   How long can you sit comfortably? 15 minutes   How long can you stand comfortably? 10 minutes   How long can you walk comfortably? pain to walk   Diagnostic tests X-ray 03/2016   Patient Stated Goals play tennis   Currently in Pain? Yes   Pain Score 3    Pain Location Back   Pain Orientation Mid;Lower   Pain Descriptors / Indicators Dull;Aching   Pain Type Chronic pain   Pain Onset More than a month ago   Pain Frequency Intermittent                         [facility] Adult PT Treatment/Exercise - 09/16/16 0001      Exercises   Exercises Knee/Hip     Lumbar Exercises: Stretches   Single Knee to Chest Stretch 3 reps;20 seconds   Piriformis Stretch 3 reps;30 seconds  bil.      Lumbar Exercises: Aerobic   Stationary Bike L1 6 minutes     Lumbar Exercises: Supine   Ab Set 20 reps  on set ab sets; one set with  red ball   Bridge 10 reps;5 seconds   Bridge Limitations 10 sets on ball with no hold at top     Knee/Hip Exercises: Seated   Sit to Sand 20 reps;without UE support     Knee/Hip Exercises: Supine   Straight Leg Raises Strengthening;Both;2 sets;10 reps     Knee/Hip Exercises: Sidelying   Hip ABduction Strengthening;Both;2 sets;10 reps   Hip ADduction Strengthening;Both;2 sets;10 reps     Knee/Hip Exercises: Prone   Hip Extension Strengthening;Both;2 sets;10 reps     Modalities   Modalities --     Moist Heat Therapy   Number Minutes Moist Heat 15 Minutes   Moist Heat Location Lumbar Spine  prone     Electrical Stimulation   Electrical Stimulation Location Lumbar spine   Electrical Stimulation Action IFC   Electrical Stimulation Parameters To tolerance   Electrical Stimulation Goals Pain                PT Education - 09/16/16 0946    Education provided Yes   Education Details straight leg raises   Frank(s) Educated Patient   Methods Explanation;Demonstration;Handout   Comprehension Verbalized understanding  PT Short Term Goals - 09/16/16 8119      PT SHORT TERM GOAL #1   Title Pt will be independent with his HEP.    Baseline issued begining exercises at eval, new exercises added today   Time 3   Period Weeks   Status Achieved     PT SHORT TERM GOAL #2   Title pt will be able to demonstrate correct body mechanics for lifting anf retrieving on object off the floor.    Baseline unable to do without pain   Time 3   Period Weeks   Status On-going     PT SHORT TERM GOAL #3   Title Pt will improve his pain to less than 3/10 with ADL's.    Baseline 5/10 at rest   Time 3   Period Weeks   Status On-going     PT SHORT TERM GOAL #4   Title Pt will be able to perform supine to sit with no pain using logroll technique.    Baseline pain reported during eval   Time 3   Period Weeks   Status On-going           PT Long Term Goals - 09/16/16  1478      PT LONG TERM GOAL #1   Title Pt wil lbe independent with HEP and progression.    Time 6   Period Weeks   Status On-going     PT LONG TERM GOAL #2   Title Pt will improve bilateral hip strength to 5/5 in order to improve functional mobility and gait.    Time 6   Period Weeks   Status On-going     PT LONG TERM GOAL #3   Title pt will be able to improve FOTO score from 53% limitation to less than 43% limitation in order to improve functional mobility.    Time 6   Period Weeks   Status On-going     PT LONG TERM GOAL #4   Title Pt will improve R hamstring flexibility to >/=75 degrees to compare to his left side in order to improve gait and prevent injury.    Baseline left SLR= 80 degrees, R SLR= 62 degrees   Time 6   Period Weeks   Status On-going               Plan - 09/16/16 0856    Clinical Impression Statement Pt continues to repotr dull ache in low back. Doing well with all exercises with no increase in pain. Pt will continue to benefit from skilled therapy for progression of core strenght and stability.    Rehab Potential Good   Clinical Impairments Affecting Rehab Potential None   PT Frequency 2x / week   PT Duration 6 weeks   PT Treatment/Interventions ADLs/Self Care Home Management;Moist Heat;Therapeutic activities;Therapeutic exercise;Traction;Manual techniques;Dry needling;Taping;Passive range of motion;Patient/family education;Neuromuscular re-education;Balance training;Ultrasound;Gait training;Stair training;Functional mobility training;Electrical Stimulation;Cryotherapy;Iontophoresis 4mg /ml Dexamethasone   PT Next Visit Plan Lumbar Tx, progress lumbar strenghtening exercises   Consulted and Agree with Plan of Care Patient      Patient will benefit from skilled therapeutic intervention in order to improve the following deficits and impairments:  Pain, Decreased mobility, Decreased strength, Decreased activity tolerance, Impaired perceived functional  ability, Difficulty walking, Decreased range of motion  Visit Diagnosis: Chronic bilateral low back pain with left-sided sciatica  Difficulty in walking, not elsewhere classified  Muscle weakness (generalized)     Problem List There are no active problems to display  for this patient.   Dessa PhiKatherine Matthews PTA 09/16/2016, 11:13 AM  [facility] [address], [address] [place], [place], [address] Phone: [phone]   Fax:  [phone]  Name: David PersonRichard Grawe MRN: [id number] Date of Birth: [birth year]

## 2016-09-16 NOTE — Patient Instructions (Signed)
Hip Flexion / Knee Extension: Straight-Leg Raise (Eccentric)   Lie on back. Lift leg with knee straight. Slowly lower leg for 3-5 seconds. _10__ reps per set, __2_ sets per day, ___ days per week. Lower like elevator, stopping at each floor. Add ___ lbs when you achieve ___ repetitions. Rest on elbows. Rest on straight arms.  ABDUCTION: Side-Lying (Active)   Lie on left side, top leg straight. Raise top leg as far as possible. Use ___ lbs. Complete _2__ sets of 10___ repetitions. Perform ___ sessions per day.  http://gtsc.exer.us/94   (Home) Extension: Hip   With support under abdomen, tighten stomach. Lift right leg in line with body. Do not hyperextend. Alternate legs. Repeat __10__ times per set. Do __2__ sets per session. Do ____ sessions per week.  ADDUCTION: Side-Lying (Active)   Lie on right side, with top leg bent and in front of other leg. Lift straight leg up as high as possible. Use ___ lbs. Complete _2__ sets of __10_ repetitions. Perform ___ sessions per day.  http://gtsc.exer.us/129   Copyright  VHI. All rights reserved.   Tyrone AppleKatie Cainan Trull PTA [facility] [address], [address] [place], [place] [address] Phone # [phone] Fax [phone]

## 2016-09-18 ENCOUNTER — Encounter: Payer: Self-pay | Admitting: Physical Therapy

## 2016-09-18 ENCOUNTER — Ambulatory Visit: Payer: BC Managed Care – PPO | Attending: Neurosurgery | Admitting: Physical Therapy

## 2016-09-18 DIAGNOSIS — M5442 Lumbago with sciatica, left side: Secondary | ICD-10-CM | POA: Insufficient documentation

## 2016-09-18 DIAGNOSIS — M6281 Muscle weakness (generalized): Secondary | ICD-10-CM

## 2016-09-18 DIAGNOSIS — R262 Difficulty in walking, not elsewhere classified: Secondary | ICD-10-CM

## 2016-09-18 DIAGNOSIS — G8929 Other chronic pain: Secondary | ICD-10-CM | POA: Diagnosis present

## 2016-09-18 NOTE — Patient Instructions (Signed)
ABDUCTION: Standing - Resistance Band (Active)   Stand, feet flat. Against yellow resistance band, lift right leg out to side. Complete _2__ sets of _10__ repetitions. Perform __3_ sessions per week.  Strengthening: Hip Flexion - Resisted   With tubing around left ankle, anchor behind, bring leg forward, keeping knee straight. Repeat _10___ times per set. Do _2___ sets per session. Do _3___ sessions per week.  Strengthening: Hip Extension - Resisted   With tubing around right ankle, face anchor and pull leg straight back. Repeat _10___ times per set. Do __2__ sets per session. Do ___3_ sessions per week.  Dessa PhiKatherine Matthews, PTA 09/18/16 10:40 AM  [facility] [address], [address] [place], [place] [address] Phone # [phone] Fax [phone]

## 2016-09-18 NOTE — Therapy (Signed)
[facility] [address], [address] [place], [place], [address] Phone: [phone]   Fax:  [phone]  Physical Therapy Treatment  Patient Details  Name: David PersonRichard Frank MRN: [id number] Date of Birth: 13-Dec-1950 Referring Provider: Shirlean Kellyobert Nudelman, MD  Encounter Date: 09/18/2016      PT End of Session - 09/18/16 1023    Visit Number 7   Number of Visits 13   Date for PT Re-Evaluation 10/27/16   PT Start Time 1019   PT Stop Time 1129   PT Time Calculation (min) 70 min   Activity Tolerance Patient tolerated treatment well   Behavior During Therapy [facility] for tasks assessed/performed      History reviewed. No pertinent past medical history.  Past Surgical History:  Procedure Laterality Date  . SPINE SURGERY      There were no vitals filed for this visit.      Subjective Assessment - 09/18/16 1022    Subjective Pt has been compliant with home exercises.    Limitations Lifting;House hold activities;Sitting   How long can you sit comfortably? 15 minutes   How long can you stand comfortably? 10 minutes   How long can you walk comfortably? pain to walk   Diagnostic tests X-ray 03/2016   Patient Stated Goals play tennis; be pain free   Currently in Pain? Yes   Pain Score 2    Pain Location Back   Pain Orientation Mid;Lower   Pain Descriptors / Indicators Dull;Aching   Pain Type Chronic pain   Pain Onset More than a month ago   Pain Frequency Intermittent                         [facility] Adult PT Treatment/Exercise - 09/18/16 0001      Lumbar Exercises: Stretches   Single Knee to Chest Stretch 3 reps;20 seconds   Piriformis Stretch 3 reps;30 seconds  bil.      Lumbar Exercises: Aerobic   Stationary Bike L1 6 minutes  Therapist present to discuss treatment     Lumbar Exercises: Supine   Bridge 10 reps;5 seconds   Other Supine Lumbar Exercises supine marching x 10 reps     Knee/Hip Exercises:  Standing   Hip Flexion Both;2 sets;10 reps  verbal cues abdominal brace   Hip Abduction Both;2 sets;10 reps  abdominal brace   Hip Extension Both;2 sets;10 reps     Knee/Hip Exercises: Seated   Sit to Sand 20 reps;without UE support     Traction   Type of Traction Lumbar   Min (lbs) 50   Max (lbs) 100   Hold Time 60 seconds   Rest Time 10 seconds   Time 15                PT Education - 09/18/16 1040    Education provided Yes   Education Details standing hip exercises   Frank(s) Educated Patient   Methods Explanation;Demonstration;Handout   Comprehension Verbalized understanding          PT Short Term Goals - 09/16/16 0923      PT SHORT TERM GOAL #1   Title Pt will be independent with his HEP.    Baseline issued begining exercises at eval, new exercises added today   Time 3   Period Weeks   Status Achieved     PT SHORT TERM GOAL #2   Title pt will be able to demonstrate correct body mechanics for  lifting anf retrieving on object off the floor.    Baseline unable to do without pain   Time 3   Period Weeks   Status On-going     PT SHORT TERM GOAL #3   Title Pt will improve his pain to less than 3/10 with ADL's.    Baseline 5/10 at rest   Time 3   Period Weeks   Status On-going     PT SHORT TERM GOAL #4   Title Pt will be able to perform supine to sit with no pain using logroll technique.    Baseline pain reported during eval   Time 3   Period Weeks   Status On-going           PT Long Term Goals - 09/16/16 [id number]      PT LONG TERM GOAL #1   Title Pt wil lbe independent with HEP and progression.    Time 6   Period Weeks   Status On-going     PT LONG TERM GOAL #2   Title Pt will improve bilateral hip strength to 5/5 in order to improve functional mobility and gait.    Time 6   Period Weeks   Status On-going     PT LONG TERM GOAL #3   Title pt will be able to improve FOTO score from 53% limitation to less than 43% limitation in order to  improve functional mobility.    Time 6   Period Weeks   Status On-going     PT LONG TERM GOAL #4   Title Pt will improve R hamstring flexibility to >/=75 degrees to compare to his left side in order to improve gait and prevent injury.    Baseline left SLR= 80 degrees, R SLR= 62 degrees   Time 6   Period Weeks   Status On-going               Plan - 09/18/16 1050    Clinical Impression Statement Pt continues to progress with Bil hip and core strength. Pt able to complete standing hip exercises today with verbal cues for posture and technique. Pt will continue to benefit from skilled therapy for strenghtening and flexibility.    Rehab Potential Good   Clinical Impairments Affecting Rehab Potential None   PT Frequency 2x / week   PT Duration 6 weeks   PT Treatment/Interventions ADLs/Self Care Home Management;Moist Heat;Therapeutic activities;Therapeutic exercise;Traction;Manual techniques;Dry needling;Taping;Passive range of motion;Patient/family education;Neuromuscular re-education;Balance training;Ultrasound;Gait training;Stair training;Functional mobility training;Electrical Stimulation;Cryotherapy;Iontophoresis 4mg /ml Dexamethasone   PT Next Visit Plan Modalities as needed, strenghtening   Consulted and Agree with Plan of Care Patient      Patient will benefit from skilled therapeutic intervention in order to improve the following deficits and impairments:  Pain, Decreased mobility, Decreased strength, Decreased activity tolerance, Impaired perceived functional ability, Difficulty walking, Decreased range of motion  Visit Diagnosis: Chronic bilateral low back pain with left-sided sciatica  Difficulty in walking, not elsewhere classified  Muscle weakness (generalized)     Problem List There are no active problems to display for this patient.   [name] PTA 09/18/2016, 11:34 AM  [facility] [address], [address] [place], [place], [address] Phone: [phone]   Fax:  [phone]  Name: David PersonRichard Mclaine MRN: [id number] Date of Birth: 1951-08-12

## 2016-09-23 ENCOUNTER — Encounter: Payer: Self-pay | Admitting: Physical Therapy

## 2016-09-23 ENCOUNTER — Ambulatory Visit: Payer: BC Managed Care – PPO | Admitting: Physical Therapy

## 2016-09-23 DIAGNOSIS — M5442 Lumbago with sciatica, left side: Principal | ICD-10-CM

## 2016-09-23 DIAGNOSIS — R262 Difficulty in walking, not elsewhere classified: Secondary | ICD-10-CM

## 2016-09-23 DIAGNOSIS — M6281 Muscle weakness (generalized): Secondary | ICD-10-CM

## 2016-09-23 DIAGNOSIS — G8929 Other chronic pain: Secondary | ICD-10-CM

## 2016-09-23 NOTE — Therapy (Signed)
[facility] [address] [place], [place], [address] Phone: [phone]   Fax:  [phone]  Physical Therapy Treatment  Patient Details  Name: David PersonRichard Frank MRN: [id number] Date of Birth: 1951/03/28 Referring Provider: Shirlean Kellyobert Nudelman, MD  Encounter Date: 09/23/2016      PT End of Session - 09/23/16 1027    Visit Number 8   Number of Visits 13   Date for PT Re-Evaluation 10/27/16   PT Start Time 1022   PT Stop Time 1117   PT Time Calculation (min) 55 min   Activity Tolerance Patient tolerated treatment well   Behavior During Therapy [facility] for tasks assessed/performed      History reviewed. No pertinent past medical history.  Past Surgical History:  Procedure Laterality Date  . SPINE SURGERY      There were no vitals filed for this visit.      Subjective Assessment - 09/23/16 1022    Subjective Pt reports high pain this morning, has been stretching and exericseing regularly. Has not begun a walking routine but is thinking about walking around track at home.    Limitations Lifting;House hold activities;Sitting   How long can you sit comfortably? 15 minutes   How long can you stand comfortably? 10 minutes   How long can you walk comfortably? pain to walk   Diagnostic tests X-ray 03/2016   Patient Stated Goals play tennis; be pain free   Currently in Pain? Yes   Pain Score 2    Pain Location Back   Pain Orientation Mid;Lower   Pain Descriptors / Indicators Aching;Dull   Pain Type Chronic pain   Pain Onset More than a month ago   Pain Frequency Intermittent                         [facility] Adult PT Treatment/Exercise - 09/23/16 0001      Lumbar Exercises: Prone   Straight Leg Raise 10 reps   Opposite Arm/Leg Raise Left arm/Right leg;Right arm/Left leg;10 reps  on stomach   Other Prone Lumbar Exercises on elbows   Other Prone Lumbar Exercises head/arm raise, 2x5  arms at side and arms  90 abduction     Knee/Hip Exercises: Aerobic   Nustep L2 x 6 minutes   Therapist present to discuess treatment     Knee/Hip Exercises: Supine   Straight Leg Raises Strengthening;Both;2 sets;10 reps  #2     Knee/Hip Exercises: Sidelying   Hip ABduction Strengthening;Both;2 sets;10 reps  #2     Knee/Hip Exercises: Prone   Hip Extension Strengthening;Both;2 sets;10 reps     Moist Heat Therapy   Number Minutes Moist Heat 15 Minutes   Moist Heat Location Lumbar Spine     Electrical Stimulation   Electrical Stimulation Location Lumbar spine   Electrical Stimulation Action IFC   Electrical Stimulation Parameters To tolerance   Electrical Stimulation Goals Pain                PT Education - 09/23/16 1117    Education provided Yes   Education Details etension strength   Frank(s) Educated Patient   Methods Explanation;Demonstration;Handout   Comprehension Verbalized understanding          PT Short Term Goals - 09/16/16 0923      PT SHORT TERM GOAL #1   Title Pt will be independent with his HEP.    Baseline issued begining exercises at eval, new exercises added today  Time 3   Period Weeks   Status Achieved     PT SHORT TERM GOAL #2   Title pt will be able to demonstrate correct body mechanics for lifting anf retrieving on object off the floor.    Baseline unable to do without pain   Time 3   Period Weeks   Status On-going     PT SHORT TERM GOAL #3   Title Pt will improve his pain to less than 3/10 with ADL's.    Baseline 5/10 at rest   Time 3   Period Weeks   Status On-going     PT SHORT TERM GOAL #4   Title Pt will be able to perform supine to sit with no pain using logroll technique.    Baseline pain reported during eval   Time 3   Period Weeks   Status On-going           PT Long Term Goals - 09/16/16 [id number]      PT LONG TERM GOAL #1   Title Pt wil lbe independent with HEP and progression.    Time 6   Period Weeks   Status On-going      PT LONG TERM GOAL #2   Title Pt will improve bilateral hip strength to 5/5 in order to improve functional mobility and gait.    Time 6   Period Weeks   Status On-going     PT LONG TERM GOAL #3   Title pt will be able to improve FOTO score from 53% limitation to less than 43% limitation in order to improve functional mobility.    Time 6   Period Weeks   Status On-going     PT LONG TERM GOAL #4   Title Pt will improve R hamstring flexibility to >/=75 degrees to compare to his left side in order to improve gait and prevent injury.    Baseline left SLR= 80 degrees, R SLR= 62 degrees   Time 6   Period Weeks   Status On-going               Plan - 09/23/16 1126    Clinical Impression Statement Pt presents with forward flexed posture. Able to tolerate all prone exercises to facilitate back extension. Pt will continue to benefit from skilled therapy for core strength and stability.    Rehab Potential Good   Clinical Impairments Affecting Rehab Potential None   PT Frequency 2x / week   PT Duration 6 weeks   PT Treatment/Interventions ADLs/Self Care Home Management;Moist Heat;Therapeutic activities;Therapeutic exercise;Traction;Manual techniques;Dry needling;Taping;Passive range of motion;Patient/family education;Neuromuscular re-education;Balance training;Ultrasound;Gait training;Stair training;Functional mobility training;Electrical Stimulation;Cryotherapy;Iontophoresis 4mg /ml Dexamethasone   PT Next Visit Plan Modalities as needed, postural strenghtening   Consulted and Agree with Plan of Care Patient      Patient will benefit from skilled therapeutic intervention in order to improve the following deficits and impairments:  Pain, Decreased mobility, Decreased strength, Decreased activity tolerance, Impaired perceived functional ability, Difficulty walking, Decreased range of motion  Visit Diagnosis: Chronic bilateral low back pain with left-sided sciatica  Difficulty in  walking, not elsewhere classified  Muscle weakness (generalized)     Problem List There are no active problems to display for this patient.   [name] PTA 09/23/2016, 12:20 PM  [facility] [address] [place], [place], [address] Phone: [phone]   Fax:  [phone]  Name: David PersonRichard Tetrick MRN: [id number] Date of Birth: 1950-12-15

## 2016-09-23 NOTE — Patient Instructions (Signed)
Arm / Leg Lift: Opposite (Prone)    Lift right leg and opposite arm  from floor, keeping knee locked. Repeat _5___ times per set. Do __2__ sets per session.   http://orth.exer.us/100   Copyright  VHI. All rights reserved.  Extension (Active)    Bring arm back as far as possible. For a greater challenge, do this while lying down on stomach. Repeat _5___ times. Do __2__ sets.  Copyright  VHI. All rights reserved.   Tyrone AppleKatie Kalena Mander PTA [facility] [address], [address] [place], [place] [address] Phone # [phone] Fax [phone]

## 2016-09-25 ENCOUNTER — Ambulatory Visit: Payer: BC Managed Care – PPO | Admitting: Physical Therapy

## 2016-09-25 DIAGNOSIS — G8929 Other chronic pain: Secondary | ICD-10-CM

## 2016-09-25 DIAGNOSIS — R262 Difficulty in walking, not elsewhere classified: Secondary | ICD-10-CM

## 2016-09-25 DIAGNOSIS — M5442 Lumbago with sciatica, left side: Principal | ICD-10-CM

## 2016-09-25 DIAGNOSIS — M6281 Muscle weakness (generalized): Secondary | ICD-10-CM

## 2016-09-25 NOTE — Therapy (Signed)
[facility] [address], [address] [place], [place], [address] Phone: [phone]   Fax:  [phone]  Physical Therapy Treatment  Patient Details  Name: David PersonRichard [name] MRN: [id number] Date of Birth: 06-Dec-1950 Referring Provider: Shirlean Kellyobert Nudelman, MD  Encounter Date: 09/25/2016      PT End of Session - 09/25/16 1042    Visit Number 8   Number of Visits 13   Date for PT Re-Evaluation 10/27/16   PT Start Time 1012   PT Stop Time 1110   PT Time Calculation (min) 58 min   Activity Tolerance Patient tolerated treatment well      No past medical history on file.  Past Surgical History:  Procedure Laterality Date  . SPINE SURGERY      There were no vitals filed for this visit.      Subjective Assessment - 09/25/16 1012    Subjective I think I'm getting better but it's slow.  If I turn the wrong way my back will catch. I think the (prone) exercises are helping.  I think the e-stim helps more than traction.     Currently in Pain? Yes   Pain Score 4    Pain Location Back   Pain Orientation Right   Aggravating Factors  step the wrong way.  lying down at night;  bending   Pain Relieving Factors sidesleeping;                           [facility] Adult PT Treatment/Exercise - 09/25/16 0001      Lumbar Exercises: Stretches   Active Hamstring Stretch --  on /off  right and left   Piriformis Stretch 3 reps;30 seconds  bil.      Lumbar Exercises: Supine   Ab Set 10 reps   Bent Knee Raise --  discontinued secondary to back pain   Isometric Hip Flexion 10 reps     Lumbar Exercises: Prone   Other Prone Lumbar Exercises prone multifidi series 5x each per HEP     Knee/Hip Exercises: Aerobic   Nustep L2 x  10 minutes   Therapist present to discuss treatment     Moist Heat Therapy   Number Minutes Moist Heat 15 Minutes   Moist Heat Location Lumbar Spine     Electrical Stimulation   Electrical Stimulation  Location Lumbar spine   Electrical Stimulation Action IFC   Electrical Stimulation Parameters prone 15 ma   Electrical Stimulation Goals Pain                PT Education - 09/25/16 1040    Education provided Yes   Education Details ab brace; multifidi activation   [name](s) Educated Patient   Methods Explanation;Demonstration;Handout   Comprehension Verbalized understanding;Returned demonstration          PT Short Term Goals - 09/25/16 1422      PT SHORT TERM GOAL #1   Title Pt will be independent with his HEP.    Status Achieved     PT SHORT TERM GOAL #2   Title pt will be able to demonstrate correct body mechanics for lifting anf retrieving on object off the floor.    Time 3   Period Weeks   Status On-going     PT SHORT TERM GOAL #3   Title Pt will improve his pain to less than 3/10 with ADL's.    Time 3   Period Weeks   Status  On-going           PT Long Term Goals - 09/25/16 1423      PT LONG TERM GOAL #1   Title Pt wil lbe independent with HEP and progression.    Period Weeks   Status On-going     PT LONG TERM GOAL #2   Title Pt will improve bilateral hip strength to 5/5 in order to improve functional mobility and gait.    Time 6   Period Weeks   Status On-going     PT LONG TERM GOAL #3   Title pt will be able to improve FOTO score from 53% limitation to less than 43% limitation in order to improve functional mobility.    Time 6   Status On-going     PT LONG TERM GOAL #4   Title Pt will improve R hamstring flexibility to >/=75 degrees to compare to his left side in order to improve gait and prevent injury.    Time 6   Period Weeks   Status On-going               Plan - 09/25/16 1415    Clinical Impression Statement The patient needs verbal cues to avoid holding his breath with exercises, particulary with abdominal muscle activation.   He lacks strength to perform bent knee lifts without increased pain and excessive lumbar arching.    The patient also complains of increased back pain with having a bowel movements and discussed strategies to avoid the Valsalva maneuver.  With verbal and tactile cues, he is able to activate lumbar multifidi with LE movements.  Therapist closely monitoring response and modifying as needed.     PT Next Visit Plan continue with progressive core and LE strengthening (needed to assist with lifting);  neural gliding;  e-stim/heat as needed for pain control      Patient will benefit from skilled therapeutic intervention in order to improve the following deficits and impairments:     Visit Diagnosis: Chronic bilateral low back pain with left-sided sciatica  Difficulty in walking, not elsewhere classified  Muscle weakness (generalized)     Problem List There are no active problems to display for this patient.  Lavinia SharpsStacy Harol Shabazz, PT 09/25/16 2:26 PM Phone: [phone] Fax: [phone] Vivien PrestoSimpson, Leonardo Makris C 09/25/2016, 2:24 PM  [facility] [address], [address] [place], [place], [address] Phone: [phone]   Fax:  [phone]  Name: David PersonRichard [name] MRN: [id number] Date of Birth: [birth year]

## 2016-09-25 NOTE — Patient Instructions (Signed)
  Exhale on the hard part of the exercise.  Do not hold breath.           Lavinia SharpsStacy Cristhian Vanhook PT [facility] [address], [address] [place], [place] [address] Phone # [phone] Fax [phone]

## 2016-09-30 ENCOUNTER — Encounter: Payer: Self-pay | Admitting: Physical Therapy

## 2016-09-30 ENCOUNTER — Ambulatory Visit: Payer: BC Managed Care – PPO | Admitting: Physical Therapy

## 2016-09-30 DIAGNOSIS — R262 Difficulty in walking, not elsewhere classified: Secondary | ICD-10-CM

## 2016-09-30 DIAGNOSIS — M5442 Lumbago with sciatica, left side: Secondary | ICD-10-CM | POA: Diagnosis not present

## 2016-09-30 DIAGNOSIS — G8929 Other chronic pain: Secondary | ICD-10-CM

## 2016-09-30 DIAGNOSIS — M6281 Muscle weakness (generalized): Secondary | ICD-10-CM

## 2016-09-30 NOTE — Therapy (Signed)
[facility] [address] [place], [place], [address] Phone: [phone]   Fax:  [phone]  Physical Therapy Treatment  Patient Details  Name: David Frank MRN: [id number] Date of Birth: [birth year] Referring Provider: Jovita Gamma, MD  Encounter Date: 09/30/2016      PT End of Session - 09/30/16 1027    Visit Number 9   Number of Visits 13   Date for PT Re-Evaluation 10/27/16   PT Start Time 1024   PT Stop Time 1110   PT Time Calculation (min) 46 min   Activity Tolerance Patient tolerated treatment well   Behavior During Therapy [facility] for tasks assessed/performed      History reviewed. No pertinent past medical history.  Past Surgical History:  Procedure Laterality Date  . SPINE SURGERY      There were no vitals filed for this visit.      Subjective Assessment - 09/30/16 1026    Subjective Pt reports having some shooting pain with certain movements.    Limitations Lifting;House hold activities;Sitting   How long can you sit comfortably? 15 minutes   How long can you stand comfortably? 10 minutes   How long can you walk comfortably? pain to walk   Diagnostic tests X-ray 03/2016   Patient Stated Goals play tennis; be pain free   Currently in Pain? Yes   Pain Score 2    Pain Location Back   Pain Orientation Right   Pain Descriptors / Indicators Aching;Dull   Pain Type Chronic pain                         [facility] Adult PT Treatment/Exercise - 09/30/16 0001      Lumbar Exercises: Stretches   Active Hamstring Stretch 2 reps;10 seconds   Piriformis Stretch 2 reps;10 seconds     Lumbar Exercises: Supine   Ab Set 10 reps  TA activation, tactile cues for proper contraction   AB Set Limitations Pt audibly counting 5 second holds   Other Supine Lumbar Exercises Diapharamic breathing x10     Lumbar Exercises: Sidelying   Clam 10 reps   Hip Abduction 10 reps     Lumbar Exercises:  Prone   Straight Leg Raise 10 reps   Opposite Arm/Leg Raise Left arm/Right leg;Right arm/Left leg;10 reps  on stomach   Other Prone Lumbar Exercises prone multifidi series 5x each per HEP     Knee/Hip Exercises: Aerobic   Nustep L2 x  6 minutes   Therapist present to discuess treatment     Knee/Hip Exercises: Supine   Straight Leg Raises Strengthening;Both;10 reps;1 set  #2     Moist Heat Therapy   Number Minutes Moist Heat 15 Minutes   Moist Heat Location Lumbar Spine     Electrical Stimulation   Electrical Stimulation Location Lumbar spine   Electrical Stimulation Action IFC   Electrical Stimulation Parameters to tolerance   Electrical Stimulation Goals Pain                  PT Short Term Goals - 09/30/16 1028      PT SHORT TERM GOAL #2   Title pt will be able to demonstrate correct body mechanics for lifting anf retrieving on object off the floor.    Time 3   Period Weeks   Status Achieved     PT SHORT TERM GOAL #3   Title Pt will improve his pain to less  than 3/10 with ADL's.    Time 3   Period Weeks   Status Partially Met  2/10 usually but increased with certain movements     PT SHORT TERM GOAL #4   Title Pt will be able to perform supine to sit with no pain using logroll technique.    Time 3   Period Weeks   Status Achieved           PT Long Term Goals - 09/30/16 1030      PT LONG TERM GOAL #1   Title Pt wil lbe independent with HEP and progression.    Time 6   Period Weeks   Status On-going     PT LONG TERM GOAL #2   Title Pt will improve bilateral hip strength to 5/5 in order to improve functional mobility and gait.    Time 6   Period Weeks   Status On-going     PT LONG TERM GOAL #3   Title pt will be able to improve FOTO score from 53% limitation to less than 43% limitation in order to improve functional mobility.    Time 6   Period Weeks   Status On-going     PT LONG TERM GOAL #4   Title Pt will improve R hamstring  flexibility to >/=75 degrees to compare to his left side in order to improve gait and prevent injury.    Baseline left SLR= 80 degrees, R SLR= 62 degrees   Time 6   Period Weeks   Status On-going               Plan - 09/30/16 1042    Clinical Impression Statement Pt moves very cautiously due to haveing shoorting pains thi certain movements. Pt has very weak abdominals. Difficulty acitvating transverse abdominous, diaphram, and multifidi. Pt will continue to benefit fom skilled therapy for core strangth and stability.    Rehab Potential Good   Clinical Impairments Affecting Rehab Potential None   PT Frequency 2x / week   PT Duration 6 weeks   PT Treatment/Interventions ADLs/Self Care Home Management;Moist Heat;Therapeutic activities;Therapeutic exercise;Traction;Manual techniques;Dry needling;Taping;Passive range of motion;Patient/family education;Neuromuscular re-education;Balance training;Ultrasound;Gait training;Stair training;Functional mobility training;Electrical Stimulation;Cryotherapy;Iontophoresis 11m/ml Dexamethasone   PT Next Visit Plan continue with progressive core and LE strengthening (needed to assist with lifting);  neural gliding;  e-stim/heat as needed for pain control   Consulted and Agree with Plan of Care Patient      Patient will benefit from skilled therapeutic intervention in order to improve the following deficits and impairments:  Pain, Decreased mobility, Decreased strength, Decreased activity tolerance, Impaired perceived functional ability, Difficulty walking, Decreased range of motion  Visit Diagnosis: Chronic bilateral low back pain with left-sided sciatica  Difficulty in walking, not elsewhere classified  Muscle weakness (generalized)     Problem List There are no active problems to display for this patient.   KMikle BosworthPTA 09/30/2016, 11:04 AM  [facility] [address]  [place] [address]Phone: [phone]  Fax:  [phone] Name: David WelteMRN: [id number] of Birth: 624-May-1952

## 2016-10-02 ENCOUNTER — Ambulatory Visit: Payer: BC Managed Care – PPO

## 2016-10-02 DIAGNOSIS — M5442 Lumbago with sciatica, left side: Principal | ICD-10-CM

## 2016-10-02 DIAGNOSIS — M6281 Muscle weakness (generalized): Secondary | ICD-10-CM

## 2016-10-02 DIAGNOSIS — G8929 Other chronic pain: Secondary | ICD-10-CM

## 2016-10-02 DIAGNOSIS — R262 Difficulty in walking, not elsewhere classified: Secondary | ICD-10-CM

## 2016-10-02 NOTE — Therapy (Signed)
[facility] [address], [place], [place], [address] Phone: [phone]   Fax:  [phone]  Physical Therapy Treatment  Patient Details  Name: David Frank MRN: [id number] Date of Birth: Feb 16, 1951 Referring Provider: Jovita Gamma, MD  Encounter Date: 10/02/2016      PT End of Session - 10/02/16 1053    Visit Number 11   Date for PT Re-Evaluation 10/27/16   PT Start Time 1017   PT Stop Time 1110   PT Time Calculation (min) 53 min   Activity Tolerance Patient tolerated treatment well   Behavior During Therapy [facility] for tasks assessed/performed      History reviewed. No pertinent past medical history.  Past Surgical History:  Procedure Laterality Date  . SPINE SURGERY      There were no vitals filed for this visit.      Subjective Assessment - 10/02/16 1023    Subjective Hip feel sore/aching.     Currently in Pain? Yes   Pain Score 3    Pain Location Back   Pain Orientation Right   Pain Descriptors / Indicators Aching;Dull   Pain Type Chronic pain   Pain Onset More than a month ago   Pain Frequency Intermittent   Aggravating Factors  stepping the wrong way, night, lateral movement   Pain Relieving Factors heating pad                         [facility] Adult PT Treatment/Exercise - 10/02/16 0001      Lumbar Exercises: Stretches   Active Hamstring Stretch 3 reps;20 seconds   Lower Trunk Rotation 3 reps;20 seconds   Piriformis Stretch 3 reps;20 seconds     Lumbar Exercises: Supine   Ab Set 10 reps  TA activation, tactile cues for proper contraction     Lumbar Exercises: Sidelying   Clam 20 reps     Lumbar Exercises: Prone   Straight Leg Raise 20 reps     Knee/Hip Exercises: Aerobic   Nustep L2 x  8 minutes   Therapist present to discuss treatment     Electrical Stimulation   Electrical Stimulation Location Lumbar spine   Electrical Stimulation Action IFC   Electrical  Stimulation Parameters 15 minutes   Electrical Stimulation Goals Pain                  PT Short Term Goals - 09/30/16 1028      PT SHORT TERM GOAL #2   Title pt will be able to demonstrate correct body mechanics for lifting anf retrieving on object off the floor.    Time 3   Period Weeks   Status Achieved     PT SHORT TERM GOAL #3   Title Pt will improve his pain to less than 3/10 with ADL's.    Time 3   Period Weeks   Status Partially Met  2/10 usually but increased with certain movements     PT SHORT TERM GOAL #4   Title Pt will be able to perform supine to sit with no pain using logroll technique.    Time 3   Period Weeks   Status Achieved           PT Long Term Goals - 09/30/16 1030      PT LONG TERM GOAL #1   Title Pt wil lbe independent with HEP and progression.    Time 6   Period Weeks  Status On-going     PT LONG TERM GOAL #2   Title Pt will improve bilateral hip strength to 5/5 in order to improve functional mobility and gait.    Time 6   Period Weeks   Status On-going     PT LONG TERM GOAL #3   Title pt will be able to improve FOTO score from 53% limitation to less than 43% limitation in order to improve functional mobility.    Time 6   Period Weeks   Status On-going     PT LONG TERM GOAL #4   Title Pt will improve R hamstring flexibility to >/=75 degrees to compare to his left side in order to improve gait and prevent injury.    Baseline left SLR= 80 degrees, R SLR= 62 degrees   Time 6   Period Weeks   Status On-going               Plan - 10/02/16 1034    Clinical Impression Statement Pt with continued core weakness and requires verbal cues for core activation.  Pt with reduced hip flexiblity and is performing regular stretching.  Pt will conitnue to benefit from skilled PT for core strength, flexiblity and manual/modalites for pain.     Rehab Potential Good   PT Frequency 2x / week   PT Duration 6 weeks   PT  Treatment/Interventions ADLs/Self Care Home Management;Moist Heat;Therapeutic activities;Therapeutic exercise;Traction;Manual techniques;Dry needling;Taping;Passive range of motion;Patient/family education;Neuromuscular re-education;Balance training;Ultrasound;Gait training;Stair training;Functional mobility training;Electrical Stimulation;Cryotherapy;Iontophoresis 84m/ml Dexamethasone   PT Next Visit Plan continue with progressive core and LE strengthenig, flexibility, e-stim/heat as needed for pain control   Consulted and Agree with Plan of Care Patient      Patient will benefit from skilled therapeutic intervention in order to improve the following deficits and impairments:  Pain, Decreased mobility, Decreased strength, Decreased activity tolerance, Impaired perceived functional ability, Difficulty walking, Decreased range of motion  Visit Diagnosis: Chronic bilateral low back pain with left-sided sciatica  Difficulty in walking, not elsewhere classified  Muscle weakness (generalized)     Problem List There are no active problems to display for this patient.    KSigurd Sos PT 10/02/16 11:01 AM  [facility] [address] [place] [address]Phone: [phone]  Fax:  [phone] Name: REthan ClayburnMRN: [id number] of Birth: 6Jan 28, 1952

## 2016-10-07 ENCOUNTER — Encounter: Payer: BC Managed Care – PPO | Admitting: Physical Therapy

## 2016-10-14 ENCOUNTER — Encounter: Payer: Self-pay | Admitting: Physical Therapy

## 2016-10-14 ENCOUNTER — Ambulatory Visit: Payer: BC Managed Care – PPO | Admitting: Physical Therapy

## 2016-10-14 DIAGNOSIS — R262 Difficulty in walking, not elsewhere classified: Secondary | ICD-10-CM

## 2016-10-14 DIAGNOSIS — G8929 Other chronic pain: Secondary | ICD-10-CM

## 2016-10-14 DIAGNOSIS — M5442 Lumbago with sciatica, left side: Principal | ICD-10-CM

## 2016-10-14 DIAGNOSIS — M6281 Muscle weakness (generalized): Secondary | ICD-10-CM

## 2016-10-14 NOTE — Therapy (Signed)
[facility] [address], [place], [place], [address] Phone: [phone]   Fax:  [phone]  Physical Therapy Treatment  Patient Details  Name: David Frank MRN: [id number] Date of Birth: 1951-02-06 Referring Provider: Jovita Gamma, MD  Encounter Date: 10/14/2016      PT End of Session - 10/14/16 0938    Visit Number 12   Number of Visits 13   Date for PT Re-Evaluation 10/27/16   PT Start Time 0933   PT Stop Time 1030   PT Time Calculation (min) 57 min   Activity Tolerance Patient tolerated treatment well   Behavior During Therapy [facility] for tasks assessed/performed      History reviewed. No pertinent past medical history.  Past Surgical History:  Procedure Laterality Date  . SPINE SURGERY      There were no vitals filed for this visit.      Subjective Assessment - 10/14/16 0936    Subjective Pt having pain in across low back with certain movements such as twisting and lifting leg.    Limitations Lifting;House hold activities;Sitting   How long can you sit comfortably? 15 minutes   How long can you stand comfortably? 10 minutes   How long can you walk comfortably? pain to walk   Diagnostic tests X-ray 03/2016   Patient Stated Goals play tennis; be pain free   Currently in Pain? Yes   Pain Score 3    Pain Location Back   Pain Orientation Right   Pain Descriptors / Indicators Aching;Dull   Pain Type Chronic pain   Pain Onset More than a month ago   Pain Frequency Intermittent   Aggravating Factors  stepping the wrong way, night, alteral movements   Multiple Pain Sites No                         [facility] Adult PT Treatment/Exercise - 10/14/16 0001      Lumbar Exercises: Stretches   Active Hamstring Stretch 3 reps;20 seconds   Piriformis Stretch 3 reps;20 seconds     Lumbar Exercises: Standing   Row Power tower;Both;20 reps   Shoulder Extension Power Tower;Both;20 reps      Lumbar Exercises: Seated   Sit to Stand 20 reps     Lumbar Exercises: Supine   Ab Set 10 reps  TA activation, tactile cues for proper contraction     Lumbar Exercises: Prone   Other Prone Lumbar Exercises prone stretch on elbows     Knee/Hip Exercises: Aerobic   Nustep L2 x  8 minutes   Therapist present to discuss treatment     Knee/Hip Exercises: Standing   Hip Abduction Both;2 sets;10 reps;Stengthening   Hip Extension Stengthening;Both;2 sets;10 reps   SLS Blue mat 30 seconds x2  increased Lt LE pain     Knee/Hip Exercises: Supine   Straight Leg Raises Strengthening;Both;10 reps;1 set  #2   Other Supine Knee/Hip Exercises Supine marches x20     Moist Heat Therapy   Number Minutes Moist Heat 15 Minutes   Moist Heat Location Lumbar Spine     Electrical Stimulation   Electrical Stimulation Location Lumbar spine   Electrical Stimulation Action IFC   Electrical Stimulation Parameters 15 minutes   Electrical Stimulation Goals Pain                PT Education - 10/14/16 9767    Education provided Yes   Education Details core stability  Person(s) Educated Patient   Methods Explanation;Handout;Demonstration   Comprehension Verbalized understanding          PT Short Term Goals - 10/14/16 0938      PT SHORT TERM GOAL #3   Title Pt will improve his pain to less than 3/10 with ADL's.    Baseline 5/10 at rest   Time 3   Period Weeks   Status Partially Met     PT SHORT TERM GOAL #4   Title Pt will be able to perform supine to sit with no pain using logroll technique.    Baseline pain reported during eval   Time 3   Period Weeks   Status Achieved           PT Long Term Goals - 10/14/16 2263      PT LONG TERM GOAL #1   Title Pt wil lbe independent with HEP and progression.    Time 6   Period Weeks   Status On-going     PT LONG TERM GOAL #2   Title Pt will improve bilateral hip strength to 5/5 in order to improve functional mobility and gait.     Time 6   Period Weeks   Status On-going     PT LONG TERM GOAL #3   Title pt will be able to improve FOTO score from 53% limitation to less than 43% limitation in order to improve functional mobility.    Time 6   Period Weeks   Status On-going     PT LONG TERM GOAL #4   Title Pt will improve R hamstring flexibility to >/=75 degrees to compare to his left side in order to improve gait and prevent injury.    Baseline left SLR= 80 degrees, R SLR= 62 degrees   Time 6   Period Weeks   Status On-going               Plan - 10/14/16 1037    Clinical Impression Statement Pt has been compliant with home exercises but has not been consistent with walking program. Pt reports having most pain first thing in the morning that feels better after walking the dog.  Pt will continue to benefit from skilled therapy for core strength and stability.    Rehab Potential Good   Clinical Impairments Affecting Rehab Potential None   PT Frequency 2x / week   PT Duration 6 weeks   PT Treatment/Interventions ADLs/Self Care Home Management;Moist Heat;Therapeutic activities;Therapeutic exercise;Traction;Manual techniques;Dry needling;Taping;Passive range of motion;Patient/family education;Neuromuscular re-education;Balance training;Ultrasound;Gait training;Stair training;Functional mobility training;Electrical Stimulation;Cryotherapy;Iontophoresis 39m/ml Dexamethasone   PT Next Visit Plan continue with progressive core and LE strengthenig, flexibility, e-stim/heat as needed for pain control      Patient will benefit from skilled therapeutic intervention in order to improve the following deficits and impairments:  Pain, Decreased mobility, Decreased strength, Decreased activity tolerance, Impaired perceived functional ability, Difficulty walking, Decreased range of motion  Visit Diagnosis: Chronic bilateral low back pain with left-sided sciatica  Difficulty in walking, not elsewhere classified  Muscle  weakness (generalized)     Problem List There are no active problems to display for this patient.   KMikle BosworthPTA 10/14/2016, 10:45 AM  [facility] [address] [place] [address]Phone: [phone]  Fax:  [phone] Name: RFriedrich HarriottMRN: [id number] of Birth: 628-May-1952

## 2016-10-14 NOTE — Patient Instructions (Signed)
(  Home) Retraction: Row - Bilateral (Anchor)    Facing anchor, arms reaching forward, pull hands toward stomach, pinching shoulder blades together. Repeat __20__ times per set. Do _2___ sets per session. Do __3__ sessions per week.   Copyright  [employer]. All rights reserved.  (Home) Extension / Flexion (Assist)    Face anchor, right arm as far forward and up as is pain free. Pull arm down toward side. Repeat __20__ times per set. Do __2__ sets per session. Do __3__ sessions per week.  Copyright  VHI. All rights reserved.   Dessa PhiKatherine Ayuub Penley, PTA 10/14/16 9:50 AM

## 2016-10-16 ENCOUNTER — Ambulatory Visit: Payer: BC Managed Care – PPO | Admitting: Physical Therapy

## 2016-10-16 ENCOUNTER — Encounter: Payer: Self-pay | Admitting: Physical Therapy

## 2016-10-16 DIAGNOSIS — G8929 Other chronic pain: Secondary | ICD-10-CM

## 2016-10-16 DIAGNOSIS — R262 Difficulty in walking, not elsewhere classified: Secondary | ICD-10-CM

## 2016-10-16 DIAGNOSIS — M6281 Muscle weakness (generalized): Secondary | ICD-10-CM

## 2016-10-16 DIAGNOSIS — M5442 Lumbago with sciatica, left side: Secondary | ICD-10-CM | POA: Diagnosis not present

## 2016-10-16 NOTE — Therapy (Addendum)
[facility] [address], [place], [place], [address] Phone: [phone]   Fax:  [phone]  Physical Therapy Treatment  Patient Details  Name: David David [name] MRN: [id number] Date of Birth: 1951/05/12 Referring Provider: Jovita Gamma, MD  Encounter Date: 10/16/2016      David [name] End of Session - 10/16/16 0934    Visit Number 13   Number of Visits 13   Date for David [name] Re-Evaluation 10/27/16   David [name] Start Time 0930   David [name] Stop Time 1029   David [name] Time Calculation (min) 59 min   Activity Tolerance Patient tolerated treatment well   Behavior During Therapy [facility] for tasks assessed/performed      History reviewed. No pertinent past medical history.  Past Surgical History:  Procedure Laterality Date  . SPINE SURGERY      There were no vitals filed for this visit.      Subjective Assessment - 10/16/16 0932    Subjective Increased Rt hip pain today upon getting out of bed.    Limitations Lifting;House hold activities;Sitting   Currently in Pain? Yes   Pain Score 3    Pain Location Back   Pain Orientation Right   Pain Descriptors / Indicators Aching   Pain Type Chronic pain   Pain Onset More than a month ago   Pain Frequency Intermittent   Aggravating Factors  Stepping the wrong way, night, alt movement   Pain Relieving Factors heating pad   Multiple Pain Sites No                         [facility] Adult David [name] Treatment/Exercise - 10/16/16 0001      Lumbar Exercises: Prone   Straight Leg Raise 10 reps  VC for multifidi activation 2x5 Bil   Opposite Arm/Leg Raise Right arm/Left leg;Left arm/Right leg;20 reps  on stomach and on hands/knees   Other Prone Lumbar Exercises prone stretch on elbows  Prone press up   Other Prone Lumbar Exercises Prone shoulder extension with head lift  2x10     Knee/Hip Exercises: Aerobic   Stationary Bike L1 x8 minutes     Moist Heat Therapy   Number Minutes Moist Heat 15  Minutes   Moist Heat Location Lumbar Spine     Traction   Type of Traction Lumbar   Min (lbs) 55   Max (lbs) 110   Hold Time 60   Rest Time 10   Time 15                  David [name] Short Term Goals - 10/14/16 6160      David [name] SHORT TERM GOAL #3   Title David [name] will improve his pain to less than 3/10 with ADL's.    Baseline 5/10 at rest   Time 3   Period Weeks   Status Partially Met     David [name] SHORT TERM GOAL #4   Title David [name] will be able to perform supine to sit with no pain using logroll technique.    Baseline pain reported during eval   Time 3   Period Weeks   Status Achieved           David [name] Long Term Goals - 10/14/16 7371      David [name] LONG TERM GOAL #1   Title David [name] wil lbe independent with HEP and progression.    Time 6   Period Weeks   Status On-going     David [name] LONG  TERM GOAL #2   Title David [name] will improve bilateral hip strength to 5/5 in order to improve functional mobility and gait.    Time 6   Period Weeks   Status On-going     David [name] LONG TERM GOAL #3   Title David [name] will be able to improve FOTO score from 53% limitation to less than 43% limitation in order to improve functional mobility.    Time 6   Period Weeks   Status On-going     David [name] LONG TERM GOAL #4   Title David [name] will improve R hamstring flexibility to >/=75 degrees to compare to his left side in order to improve gait and prevent injury.    Baseline left SLR= 80 degrees, R SLR= 62 degrees   Time 6   Period Weeks   Status On-going               Plan - 10/16/16 1015    Clinical Impression Statement David [name] continues to have increase pain with certain movements and upon waking in the morning. David [name] able to tolerate all prone exercises and stretches well. David [name] will continue to benefit from skilled therapy for core strength and stability and managment of pain symptoms.    Rehab Potential Good   Clinical Impairments Affecting Rehab Potential None   David [name] Frequency 2x / week   David [name] Duration 6 weeks   David [name] Treatment/Interventions ADLs/Self Care  Home Management;Moist Heat;Therapeutic activities;Therapeutic exercise;Traction;Manual techniques;Dry needling;Taping;Passive range of motion;Patient/family education;Neuromuscular re-education;Balance training;Ultrasound;Gait training;Stair training;Functional mobility training;Electrical Stimulation;Cryotherapy;Iontophoresis 74m/ml Dexamethasone   David [name] Next Visit Plan Prone exercises and stretching, modalities as needed   David [name] Home Exercise Plan progress as needed   Consulted and Agree with Plan of Care Patient      Patient will benefit from skilled therapeutic intervention in order to improve the following deficits and impairments:  Pain, Decreased mobility, Decreased strength, Decreased activity tolerance, Impaired perceived functional ability, Difficulty walking, Decreased range of motion  Visit Diagnosis: Chronic bilateral low back pain with left-sided sciatica  Difficulty in walking, not elsewhere classified  Muscle weakness (generalized)     Problem List There are no active problems to display for this patient.   David David [name] 10/16/2016, 10:30 AM PHYSICAL THERAPY DISCHARGE SUMMARY  Visits from Start of Care: 13  Current functional level related to goals / functional outcomes: See above for most current status.     Remaining deficits: See above.  David [name] didn't return to David [name].   Education / Equipment: HEP, bEconomisteducation Plan: Patient agrees to discharge.  Patient goals were partially met. Patient is being discharged due to not returning since the last visit.  ?????        David David [name] 12/31/16 2:37 PM   [facility] [address] [place] [address]Phone: [phone]  Fax:  [phone] Name: David DannerMRN: [id number] of Birth: 6Oct 04, 1952

## 2017-12-30 ENCOUNTER — Other Ambulatory Visit: Payer: Self-pay | Admitting: Orthopedic Surgery

## 2017-12-30 DIAGNOSIS — M961 Postlaminectomy syndrome, not elsewhere classified: Secondary | ICD-10-CM

## 2018-01-02 ENCOUNTER — Ambulatory Visit
Admission: RE | Admit: 2018-01-02 | Discharge: 2018-01-02 | Disposition: A | Discharge status: Home / Self Care | Payer: BC Managed Care – PPO | Source: Ambulatory Visit | Attending: Orthopedic Surgery | Admitting: Orthopedic Surgery

## 2018-01-02 DIAGNOSIS — M961 Postlaminectomy syndrome, not elsewhere classified: Secondary | ICD-10-CM

## 2018-01-02 MED ORDER — GADOBENATE DIMEGLUMINE 529 MG/ML IV SOLN
20.0000 mL | Freq: Once | INTRAVENOUS | Status: AC | PRN
  Administered 2018-01-02: 20 mL via INTRAVENOUS

## 2018-08-26 ENCOUNTER — Other Ambulatory Visit: Payer: Self-pay | Admitting: Internal Medicine

## 2018-08-26 DIAGNOSIS — R202 Paresthesia of skin: Secondary | ICD-10-CM

## 2018-09-08 ENCOUNTER — Ambulatory Visit
Admission: RE | Admit: 2018-09-08 | Discharge: 2018-09-08 | Disposition: A | Discharge status: Home / Self Care | Payer: BC Managed Care – PPO | Source: Ambulatory Visit | Attending: Internal Medicine | Admitting: Internal Medicine

## 2018-09-08 DIAGNOSIS — R202 Paresthesia of skin: Secondary | ICD-10-CM

## 2018-09-08 MED ORDER — GADOBENATE DIMEGLUMINE 529 MG/ML IV SOLN
20.0000 mL | Freq: Once | INTRAVENOUS | Status: AC | PRN
  Administered 2018-09-08: 20 mL via INTRAVENOUS

## 2020-04-25 ENCOUNTER — Emergency Department (HOSPITAL_COMMUNITY)
Admission: EM | Admit: 2020-04-25 | Discharge: 2020-04-26 | Disposition: A | Discharge status: Home / Self Care | Payer: BC Managed Care – PPO | Attending: Emergency Medicine | Admitting: Emergency Medicine

## 2020-04-25 ENCOUNTER — Encounter (HOSPITAL_COMMUNITY): Payer: Self-pay | Admitting: Emergency Medicine

## 2020-04-25 ENCOUNTER — Emergency Department (HOSPITAL_COMMUNITY): Payer: BC Managed Care – PPO

## 2020-04-25 ENCOUNTER — Other Ambulatory Visit: Payer: Self-pay

## 2020-04-25 DIAGNOSIS — F0390 Unspecified dementia without behavioral disturbance: Secondary | ICD-10-CM | POA: Diagnosis not present

## 2020-04-25 DIAGNOSIS — R0789 Other chest pain: Secondary | ICD-10-CM | POA: Diagnosis not present

## 2020-04-25 DIAGNOSIS — R079 Chest pain, unspecified: Secondary | ICD-10-CM

## 2020-04-25 LAB — CBC
HCT: 45.3 % (ref 39.0–52.0)
Hemoglobin: 14.9 g/dL (ref 13.0–17.0)
MCH: 30.3 pg (ref 26.0–34.0)
MCHC: 32.9 g/dL (ref 30.0–36.0)
MCV: 92.3 fL (ref 80.0–100.0)
Platelets: 235 10*3/uL (ref 150–400)
RBC: 4.91 MIL/uL (ref 4.22–5.81)
RDW: 13.3 % (ref 11.5–15.5)
WBC: 11.5 10*3/uL — ABNORMAL HIGH (ref 4.0–10.5)
nRBC: 0 % (ref 0.0–0.2)

## 2020-04-25 LAB — BASIC METABOLIC PANEL
Anion gap: 18 — ABNORMAL HIGH (ref 5–15)
BUN: 15 mg/dL (ref 8–23)
CO2: 21 mmol/L — ABNORMAL LOW (ref 22–32)
Calcium: 9.4 mg/dL (ref 8.9–10.3)
Chloride: 96 mmol/L — ABNORMAL LOW (ref 98–111)
Creatinine, Ser: 1.07 mg/dL (ref 0.61–1.24)
GFR calc Af Amer: >60 mL/min (ref >60)
GFR calc non Af Amer: >60 mL/min (ref >60)
Glucose, Bld: 152 mg/dL — ABNORMAL HIGH (ref 70–99)
Potassium: 4.2 mmol/L (ref 3.5–5.1)
Sodium: 135 mmol/L (ref 135–145)

## 2020-04-25 LAB — TROPONIN I (HIGH SENSITIVITY): Troponin I (High Sensitivity): <2 ng/L (ref <18)

## 2020-04-25 MED ORDER — SODIUM CHLORIDE 0.9% FLUSH: 3.0000 mL | Freq: Once | INTRAVENOUS | Status: DC

## 2020-04-25 NOTE — ED Triage Notes (Signed)
Pt presents to ED POV. Pt states that he has been having CP for an unknown amount of time. Told by PCP that he needs to be seen. Pt states that pain is a 2/10 on R side of chest. No cardiac hx.

## 2020-04-26 LAB — TROPONIN I (HIGH SENSITIVITY): Troponin I (High Sensitivity): <2 ng/L (ref <18)

## 2020-04-26 NOTE — ED Provider Notes (Signed)
See downtime paper chart documentation.    David Frank, Barbara Cower, MD 04/26/20 [phone]

## 2020-12-12 DIAGNOSIS — M5136 Other intervertebral disc degeneration, lumbar region: Secondary | ICD-10-CM | POA: Diagnosis not present

## 2020-12-12 DIAGNOSIS — E782 Mixed hyperlipidemia: Secondary | ICD-10-CM | POA: Diagnosis not present

## 2020-12-12 DIAGNOSIS — Z23 Encounter for immunization: Secondary | ICD-10-CM | POA: Diagnosis not present

## 2020-12-12 DIAGNOSIS — G309 Alzheimer's disease, unspecified: Secondary | ICD-10-CM | POA: Diagnosis not present

## 2020-12-12 DIAGNOSIS — Z0000 Encounter for general adult medical examination without abnormal findings: Secondary | ICD-10-CM | POA: Diagnosis not present

## 2020-12-12 DIAGNOSIS — G629 Polyneuropathy, unspecified: Secondary | ICD-10-CM | POA: Diagnosis not present

## 2021-02-22 ENCOUNTER — Ambulatory Visit: Payer: Medicare Other | Admitting: Podiatry

## 2021-02-22 ENCOUNTER — Ambulatory Visit (INDEPENDENT_AMBULATORY_CARE_PROVIDER_SITE_OTHER): Payer: Medicare Other

## 2021-02-22 ENCOUNTER — Other Ambulatory Visit: Payer: Self-pay

## 2021-02-22 ENCOUNTER — Other Ambulatory Visit: Payer: Self-pay | Admitting: Podiatry

## 2021-02-22 DIAGNOSIS — G629 Polyneuropathy, unspecified: Secondary | ICD-10-CM | POA: Diagnosis not present

## 2021-02-22 DIAGNOSIS — L600 Ingrowing nail: Secondary | ICD-10-CM | POA: Diagnosis not present

## 2021-02-22 DIAGNOSIS — R200 Anesthesia of skin: Secondary | ICD-10-CM

## 2021-02-22 MED ORDER — PREGABALIN 75 MG PO CAPS
75.0000 mg | ORAL_CAPSULE | Freq: Two times a day (BID) | ORAL | 5 refills | Status: AC
Start: 2021-02-22 — End: ?

## 2021-02-22 NOTE — Patient Instructions (Signed)

## 2021-02-22 NOTE — Progress Notes (Signed)
Subjective:   Patient ID: David Frank, male   DOB: 70 y.o.   MRN: [id number]   HPI Patient presents stating he gets numbness in both of his feet and just generalized discomfort and on the right big toe the nail corner is very sore on the lateral corner very irritative and causes shooting pains.  Has tried gabapentin in the past and it did seem to give him headaches and has not tried anything else and does not smoke and would like to be more active   Review of Systems  All other systems reviewed and are negative.       Objective:  Physical Exam Vitals and nursing note reviewed.  Constitutional:      Appearance: He is well-developed.  Pulmonary:     Effort: Pulmonary effort is normal.  Musculoskeletal:        General: Normal range of motion.  Skin:    General: Skin is warm.  Neurological:     Mental Status: He is alert.     Neurovascular status found to be diminished both sharp dull vibratory with good circulatory status noted.  Patient is found to have significant incurvation of the right hallux lateral border that is painful when pressed no active drainage no redness associated with it but sore and is not a good historian as far as discomfort that he is experiencing.  Good digital perfusion noted bilateral     Assessment:  Appears to have neuropathy-like symptoms along with ingrown toenail deformity right hallux with mild dementia symptomatology     Plan:  H&P x-rays reviewed and I discussed nail correction and that this may help overall with the discomfort but not the numbing pains.  Patient wants this done understands risk signed consent form and today I infiltrated the right hallux 60 mg like Marcaine mixture sterile prep done and using sterile instrumentation I removed the lateral border exposed matrix applied phenol 3 applications 30 seconds followed by alcohol lavage sterile dressing gave instructions on soaks and to leave dressing on 24 hours but take it off earlier if  any throbbing were to occur.  Patient will try Lyrica now and will see whether or not this could help and if it does cause headaches or causes problems he is can stop taking it immediately and he will start with just 1 at night and see how he does over the next few weeks  X-rays were negative for signs of fracture or signs of arthritis that might explain some of the discomfort he experiences beyond numbness      lyr

## 2021-03-26 DIAGNOSIS — M792 Neuralgia and neuritis, unspecified: Secondary | ICD-10-CM | POA: Diagnosis not present

## 2021-03-26 DIAGNOSIS — G250 Essential tremor: Secondary | ICD-10-CM | POA: Diagnosis not present

## 2021-05-01 DIAGNOSIS — H2513 Age-related nuclear cataract, bilateral: Secondary | ICD-10-CM | POA: Diagnosis not present

## 2021-05-28 DIAGNOSIS — M792 Neuralgia and neuritis, unspecified: Secondary | ICD-10-CM | POA: Diagnosis not present

## 2021-05-28 DIAGNOSIS — F32A Depression, unspecified: Secondary | ICD-10-CM | POA: Diagnosis not present

## 2021-09-02 DIAGNOSIS — Z9189 Other specified personal risk factors, not elsewhere classified: Secondary | ICD-10-CM | POA: Diagnosis not present

## 2021-09-02 DIAGNOSIS — M792 Neuralgia and neuritis, unspecified: Secondary | ICD-10-CM | POA: Diagnosis not present

## 2021-09-02 DIAGNOSIS — Z9181 History of falling: Secondary | ICD-10-CM | POA: Diagnosis not present

## 2021-09-02 DIAGNOSIS — G5793 Unspecified mononeuropathy of bilateral lower limbs: Secondary | ICD-10-CM | POA: Diagnosis not present

## 2021-09-02 DIAGNOSIS — G250 Essential tremor: Secondary | ICD-10-CM | POA: Diagnosis not present

## 2021-09-02 DIAGNOSIS — G300 Alzheimer's disease with early onset: Secondary | ICD-10-CM | POA: Diagnosis not present

## 2021-10-07 DIAGNOSIS — M792 Neuralgia and neuritis, unspecified: Secondary | ICD-10-CM | POA: Diagnosis not present

## 2021-10-07 DIAGNOSIS — G300 Alzheimer's disease with early onset: Secondary | ICD-10-CM | POA: Diagnosis not present

## 2021-10-07 DIAGNOSIS — Z9189 Other specified personal risk factors, not elsewhere classified: Secondary | ICD-10-CM | POA: Diagnosis not present

## 2021-12-09 DIAGNOSIS — G5793 Unspecified mononeuropathy of bilateral lower limbs: Secondary | ICD-10-CM | POA: Diagnosis not present

## 2021-12-09 DIAGNOSIS — F02818 Dementia in other diseases classified elsewhere, unspecified severity, with other behavioral disturbance: Secondary | ICD-10-CM | POA: Diagnosis not present

## 2021-12-09 DIAGNOSIS — G250 Essential tremor: Secondary | ICD-10-CM | POA: Diagnosis not present

## 2021-12-09 DIAGNOSIS — G309 Alzheimer's disease, unspecified: Secondary | ICD-10-CM | POA: Diagnosis not present

## 2022-01-04 ENCOUNTER — Encounter (HOSPITAL_COMMUNITY): Payer: Self-pay | Admitting: Emergency Medicine

## 2022-01-04 ENCOUNTER — Ambulatory Visit (INDEPENDENT_AMBULATORY_CARE_PROVIDER_SITE_OTHER): Payer: Medicare Other

## 2022-01-04 ENCOUNTER — Ambulatory Visit (HOSPITAL_COMMUNITY)
Admission: EM | Admit: 2022-01-04 | Discharge: 2022-01-04 | Disposition: A | Discharge status: Home / Self Care | Payer: Medicare Other | Attending: Emergency Medicine | Admitting: Emergency Medicine

## 2022-01-04 DIAGNOSIS — R059 Cough, unspecified: Secondary | ICD-10-CM | POA: Diagnosis not present

## 2022-01-04 DIAGNOSIS — R051 Acute cough: Secondary | ICD-10-CM

## 2022-01-04 MED ORDER — ALBUTEROL SULFATE HFA 108 (90 BASE) MCG/ACT IN AERS: 1.0000 | INHALATION_SPRAY | Freq: Four times a day (QID) | RESPIRATORY_TRACT | 1 refills | Status: AC | PRN

## 2022-01-04 MED ORDER — BENZONATATE 100 MG PO CAPS: 100.0000 mg | ORAL_CAPSULE | Freq: Four times a day (QID) | ORAL | 1 refills | Status: AC | PRN

## 2022-01-04 NOTE — ED Provider Notes (Signed)
[facility]   [id number] 01/04/22 Arrival Time: 1216   Chief Complaint  Patient presents with   Cough     SUBJECTIVE: History from: patient.  David Frank is a 71 y.o. male who presented to the urgent care for complaint of cough for the past 6 weeks.  Reports cough is worse at night..  Denies sick exposure to COVID, flu or strep.  Denies recent travel.  Has tried OTC medication without relief.  Denies alleviating or aggravating factors.  Denies previous symptoms in the past.   Denies fever, chills, fatigue, sinus pain, rhinorrhea, sore throat, SOB, wheezing, chest pain, nausea, changes in bowel or bladder habits.     ROS: As per HPI.  All other pertinent ROS negative.      History reviewed. No pertinent past medical history. Past Surgical History:  Procedure Laterality Date   SPINE SURGERY     Allergies  Allergen Reactions   Cefaclor Rash    Other reaction(s): rash on back Pt recalls antibiotic allergy Pt recalls antibiotic allergy    No current facility-administered medications on file prior to encounter.   Current Outpatient Medications on File Prior to Encounter  Medication Sig Dispense Refill   atorvastatin (LIPITOR) 10 MG tablet every morning before breakfast.     donepezil (ARICEPT) 10 MG tablet 1 tablet at bedtime.     pregabalin (LYRICA) 75 MG capsule Take 1 capsule (75 mg total) by mouth 2 (two) times daily. 60 capsule 5   Social History   Socioeconomic History   Marital status: Married    Spouse name: Not on file   Number of children: Not on file   Years of education: Not on file   Highest education level: Not on file  Occupational History   Not on file  Tobacco Use   Smoking status: Never   Smokeless tobacco: Never  Substance and Sexual Activity   Alcohol use: Not on file   Drug use: Not on file   Sexual activity: Not on file  Other Topics Concern   Not on file  Social History Narrative   Not on file   Social Determinants of  Health   Financial Resource Strain: Not on file  Food Insecurity: Not on file  Transportation Needs: Not on file  Physical Activity: Not on file  Stress: Not on file  Social Connections: Not on file  Intimate Partner Violence: Not on file   No family history on file.  OBJECTIVE:  Vitals:   01/04/22 1430  BP: (!) 122/96  Pulse: 72  Resp: 18  Temp: 97.8 F (36.6 C)  TempSrc: Oral  SpO2: 100%     General appearance: alert; appears fatigued, but nontoxic; speaking in full sentences and tolerating own secretions HEENT: NCAT; Ears: EACs clear, TMs pearly gray; Eyes: PERRL.  EOM grossly intact. Sinuses: nontender; Nose: nares patent without rhinorrhea, Throat: oropharynx clear, tonsils non erythematous or enlarged, uvula midline  Neck: supple without LAD Lungs: unlabored respirations, symmetrical air entry; cough: moderate; no respiratory distress; CTAB Heart: regular rate and rhythm.  Radial pulses 2+ symmetrical bilaterally Skin: warm and dry Psychological: alert and cooperative; normal mood and affect  LABS:  No results found for this or any previous visit (from the past 24 hour(s)).   RADIOLOGY  DG Chest 2 View  Result Date: 01/04/2022 CLINICAL DATA:  Cough EXAM: CHEST - 2 VIEW COMPARISON:  Chest x-ray 04/25/2020 FINDINGS: Heart size and mediastinal contours are within normal limits. No suspicious pulmonary opacities identified.  No pleural effusion or pneumothorax visualized. No acute osseous abnormality appreciated. IMPRESSION: No acute intrathoracic process identified. Electronically Signed   By: Jannifer Hick M.D.   On: 01/04/2022 16:21     Chest X-ray is negative for acute cardiopulmonary disease.  I have reviewed the x-ray myself and the radiologist interpretation.  I am in agreement with the radiologist interpretation.   ASSESSMENT & PLAN:  1. Acute cough     Meds ordered this encounter  Medications   albuterol (VENTOLIN HFA) 108 (90 Base) MCG/ACT inhaler     Sig: Inhale 1-2 puffs into the lungs every 6 (six) hours as needed for wheezing or shortness of breath.    Dispense:  18 g    Refill:  1   benzonatate (TESSALON) 100 MG capsule    Sig: Take 1 capsule (100 mg total) by mouth every 6 (six) hours as needed for cough.    Dispense:  30 capsule    Refill:  1   Discharge instructions  Get plenty of rest and push fluids Tessalon Perles prescribed for cough ProAir was prescribed/take as directed  use medications daily for symptom relief Use OTC medications like ibuprofen or tylenol as needed fever or pain Call or go to the ED if you have any new or worsening symptoms such as fever, worsening cough, shortness of breath, chest tightness, chest pain, turning blue, changes in mental status, etc...   Reviewed expectations re: course of current medical issues. Questions answered. Outlined signs and symptoms indicating need for more acute intervention. Patient verbalized understanding. After Visit Summary given.          Durward Parcel, FNP 01/04/22 1711

## 2022-01-04 NOTE — ED Triage Notes (Signed)
Pt had cough for about 5-6 weeks. Reports worse at night. Mostly dry cough with occasional phlegm. Tried OTC meds and not helping.

## 2022-01-04 NOTE — Discharge Instructions (Signed)
Get plenty of rest and push fluids Tessalon Perles prescribed for cough ProAir was prescribed/take as directed  use medications daily for symptom relief Use OTC medications like ibuprofen or tylenol as needed fever or pain Call or go to the ED if you have any new or worsening symptoms such as fever, worsening cough, shortness of breath, chest tightness, chest pain, turning blue, changes in mental status, etc..Marland Kitchen

## 2022-01-22 DIAGNOSIS — G629 Polyneuropathy, unspecified: Secondary | ICD-10-CM | POA: Diagnosis not present

## 2022-01-22 DIAGNOSIS — E782 Mixed hyperlipidemia: Secondary | ICD-10-CM | POA: Diagnosis not present

## 2022-01-22 DIAGNOSIS — Z0000 Encounter for general adult medical examination without abnormal findings: Secondary | ICD-10-CM | POA: Diagnosis not present

## 2022-01-22 DIAGNOSIS — G309 Alzheimer's disease, unspecified: Secondary | ICD-10-CM | POA: Diagnosis not present

## 2022-01-22 DIAGNOSIS — M5136 Other intervertebral disc degeneration, lumbar region: Secondary | ICD-10-CM | POA: Diagnosis not present

## 2022-01-22 DIAGNOSIS — G250 Essential tremor: Secondary | ICD-10-CM | POA: Diagnosis not present

## 2022-03-11 DIAGNOSIS — R7309 Other abnormal glucose: Secondary | ICD-10-CM | POA: Diagnosis not present

## 2022-03-11 DIAGNOSIS — F02818 Dementia in other diseases classified elsewhere, unspecified severity, with other behavioral disturbance: Secondary | ICD-10-CM | POA: Diagnosis not present

## 2022-03-11 DIAGNOSIS — G309 Alzheimer's disease, unspecified: Secondary | ICD-10-CM | POA: Diagnosis not present

## 2022-03-11 DIAGNOSIS — G250 Essential tremor: Secondary | ICD-10-CM | POA: Diagnosis not present

## 2022-03-11 DIAGNOSIS — G5793 Unspecified mononeuropathy of bilateral lower limbs: Secondary | ICD-10-CM | POA: Diagnosis not present

## 2022-08-18 DIAGNOSIS — M79672 Pain in left foot: Secondary | ICD-10-CM | POA: Diagnosis not present

## 2022-08-18 DIAGNOSIS — M79671 Pain in right foot: Secondary | ICD-10-CM | POA: Diagnosis not present

## 2022-09-11 DIAGNOSIS — G309 Alzheimer's disease, unspecified: Secondary | ICD-10-CM | POA: Diagnosis not present

## 2022-09-11 DIAGNOSIS — G250 Essential tremor: Secondary | ICD-10-CM | POA: Diagnosis not present

## 2022-09-11 DIAGNOSIS — F02818 Dementia in other diseases classified elsewhere, unspecified severity, with other behavioral disturbance: Secondary | ICD-10-CM | POA: Diagnosis not present

## 2023-10-20 IMAGING — DX DG CHEST 2V
2 series · 2 of 2 positions shown · non-contrast
Comparison: Chest x-ray 04/25/2020

CLINICAL DATA: Cough

EXAM:
CHEST - 2 VIEW

[chest pa]
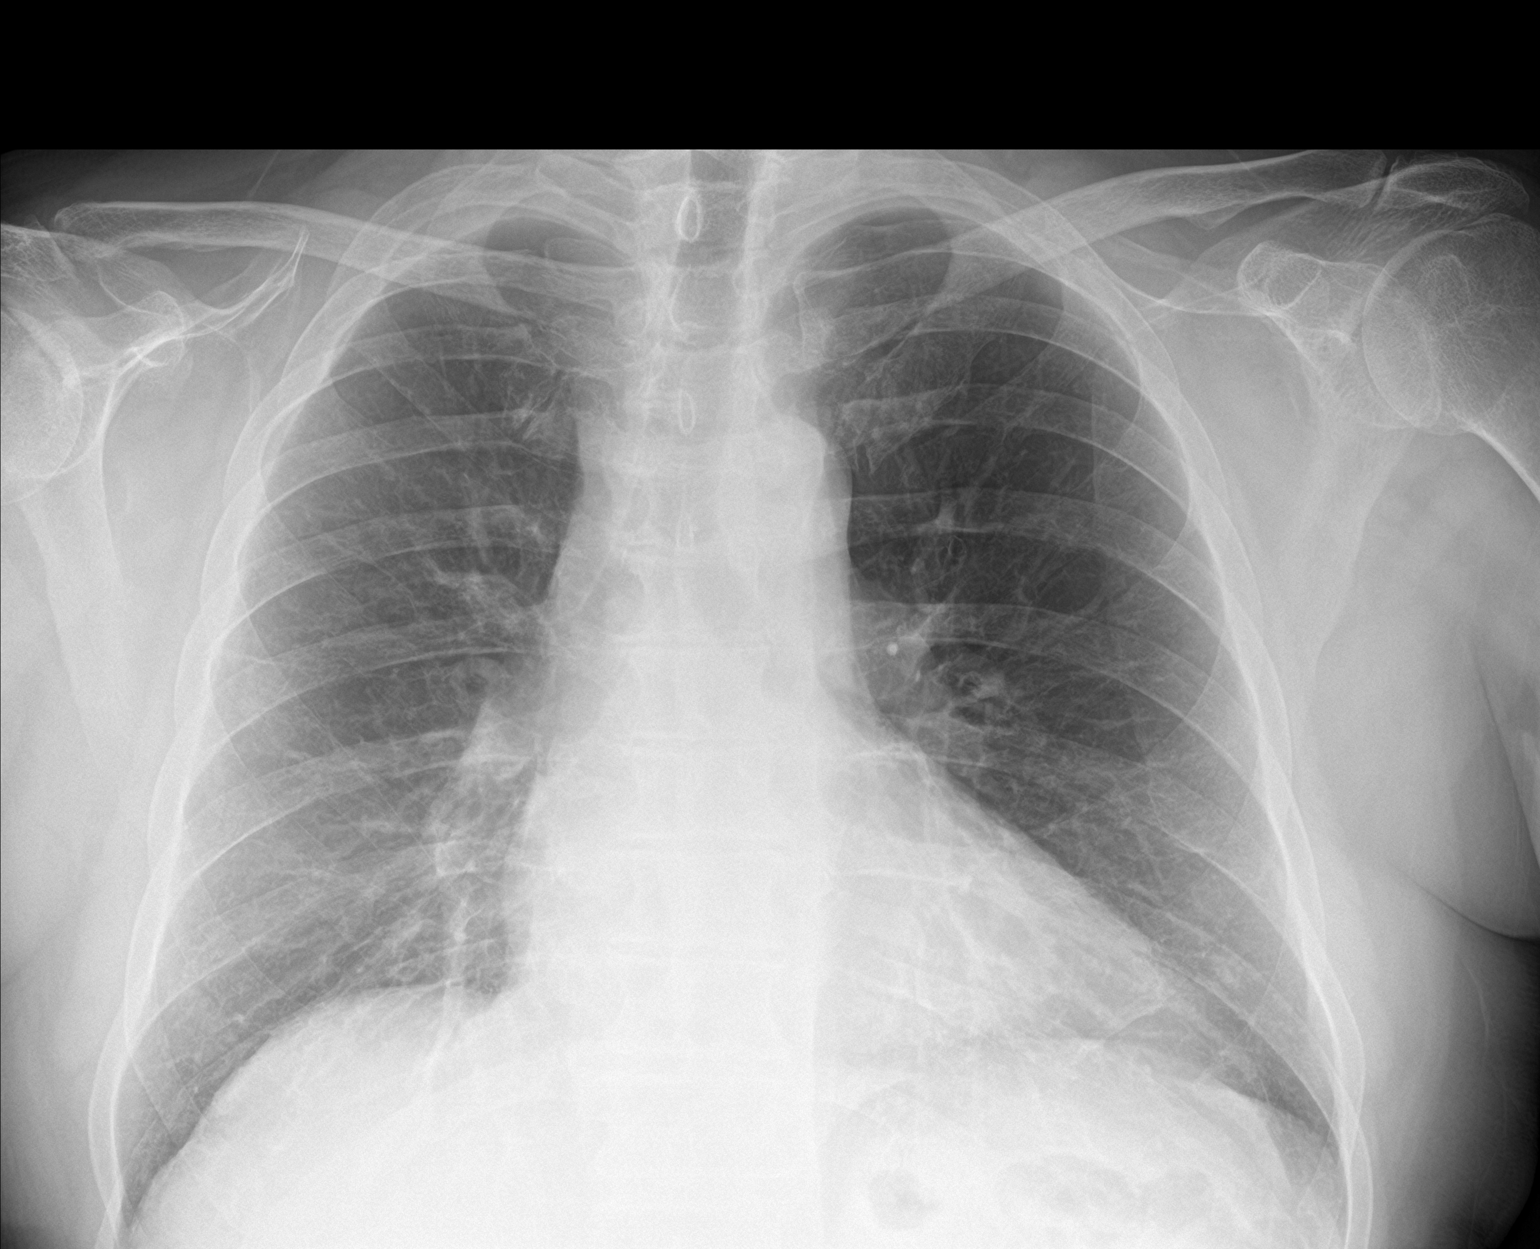

[chest lat]
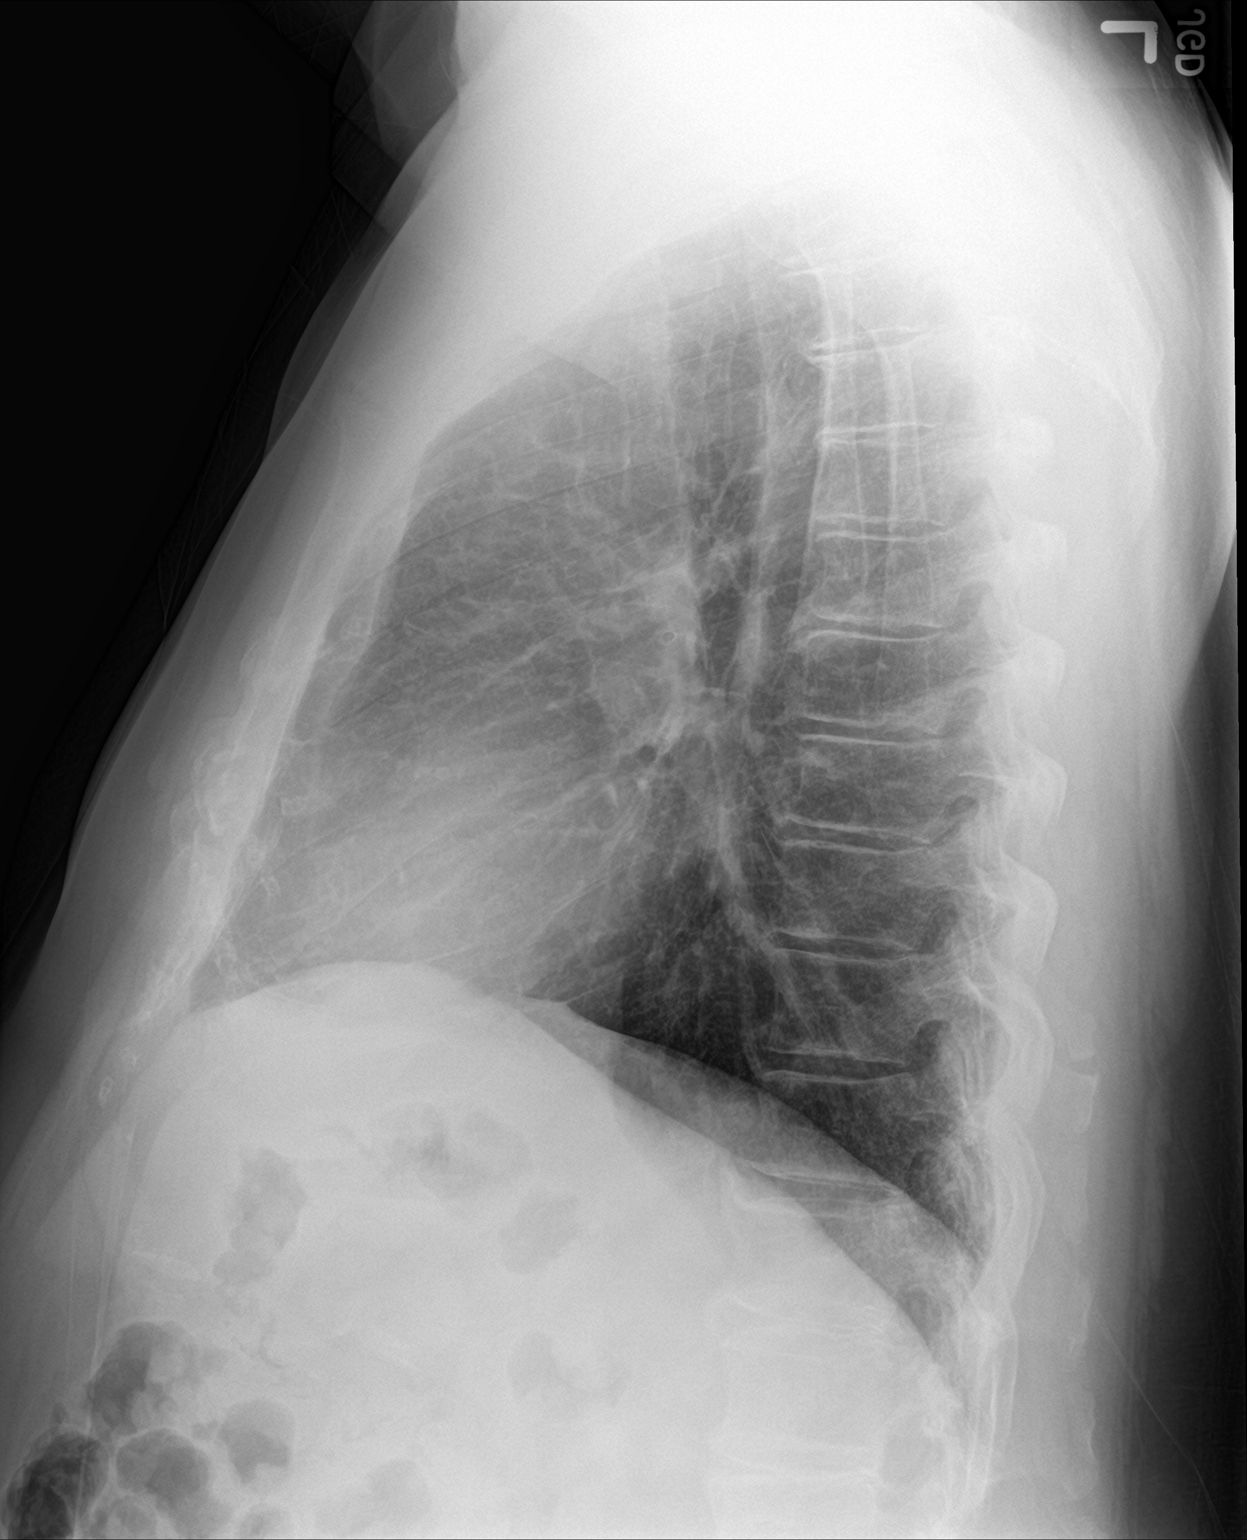

[2 of 2 positions shown; findings below may reference images not displayed]

FINDINGS: Heart size and mediastinal contours are within normal limits. No
suspicious pulmonary opacities identified.

No pleural effusion or pneumothorax visualized.

No acute osseous abnormality appreciated.
IMPRESSION: No acute intrathoracic process identified.
